# Patient Record
Sex: Female | Born: 1995 | Race: Asian | Hispanic: No | Marital: Single | Smoking: Never smoker
Health system: Southern US, Community
[De-identification: ages and names within clinical notes are randomized; demographics above are authoritative.]

## PROBLEM LIST (undated history)

## (undated) HISTORY — PX: WISDOM TOOTH EXTRACTION: SHX21

---
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
## 2017-01-03 ENCOUNTER — Encounter: Payer: Self-pay | Admitting: Family Medicine

## 2017-01-03 ENCOUNTER — Ambulatory Visit: Payer: BC Managed Care – PPO | Admitting: Family Medicine

## 2017-01-03 ENCOUNTER — Other Ambulatory Visit (HOSPITAL_COMMUNITY)
Admission: RE | Admit: 2017-01-03 | Discharge: 2017-01-03 | Disposition: A | Discharge status: Home / Self Care | Payer: BC Managed Care – PPO | Source: Ambulatory Visit | Attending: Family Medicine | Admitting: Family Medicine

## 2017-01-03 VITALS — BP 118/70 | HR 72 | Temp 98.2°F | Wt 133.0 lb

## 2017-01-03 DIAGNOSIS — N898 Other specified noninflammatory disorders of vagina: Secondary | ICD-10-CM | POA: Insufficient documentation

## 2017-01-03 DIAGNOSIS — Z23 Encounter for immunization: Secondary | ICD-10-CM | POA: Diagnosis not present

## 2017-01-03 DIAGNOSIS — E538 Deficiency of other specified B group vitamins: Secondary | ICD-10-CM

## 2017-01-03 MED ORDER — FLUCONAZOLE 150 MG PO TABS: ORAL_TABLET | ORAL | 0 refills | Status: DC

## 2017-01-03 MED ORDER — NYSTATIN-TRIAMCINOLONE 100000-0.1 UNIT/GM-% EX OINT: 1.0000 "application " | TOPICAL_OINTMENT | Freq: Two times a day (BID) | CUTANEOUS | 0 refills | Status: DC

## 2017-01-03 NOTE — Progress Notes (Signed)
Harlon FlorShalini Packard is a 21 y.o. female here for an acute visit.  History of Present Illness:   HPI:   Vaginitis Patient presents for evaluation of a vaginal discharge and itching at perineum. Symptoms have been present for a few weeks. Vaginal symptoms: discharge described as white and curd-like and local irritation. Contraception: OCP (estrogen/progesterone). She denies abnormal bleeding, blisters, bumps, dyspareunia, lesions, tears, urinary symptoms of dysuria, hematuria and lower abdominal pain and warts. Sexually transmitted infection risk: very low risk of STD exposure. Menstrual flow: regular every 28-30 days.  PMHx, SurgHx, SocialHx, Medications, and Allergies were reviewed in the Visit Navigator and updated as appropriate.  Current Medications:   .  cetirizine (ZYRTEC) 10 MG tablet, Take 10 mg by mouth daily., Disp: , Rfl:  .  levonorgestrel-ethinyl estradiol (AVIANE,ALESSE,LESSINA) 0.1-20 MG-MCG tablet, Take 1 tablet by mouth daily. (Patient not taking: Reported on 08/30/2016), Disp: 1 Package, Rfl: 11 .  Multiple Vitamin (MULTIVITAMIN) tablet, Take 1 tablet by mouth daily., Disp: , Rfl:  .  Omega-3 Fatty Acids (OMEGA-3 FISH OIL PO), Take 1 capsule by mouth daily., Disp: , Rfl:  .  valACYclovir (VALTREX) 1000 MG tablet, Take two tablets ( total 2000 mg) by mouth q12h x 1 day; Start: ASAP after symptom onset (Patient not taking: Reported on 08/30/2016), Disp: 6 tablet, Rfl: 2   No Known Allergies   Review of Systems:   Pertinent items are noted in the HPI. Otherwise, ROS is negative.  Vitals:   Vitals:   01/03/17 1323  BP: 118/70  Pulse: 72  Temp: 98.2 F (36.8 C)  TempSrc: Oral  SpO2: 100%  Weight: 133 lb (60.3 kg)     Body mass index is 22.13 kg/m.   Physical Exam:   Physical Exam  Constitutional: She appears well-nourished.  HENT:  Head: Normocephalic and atraumatic.  Eyes: EOM are normal. Pupils are equal, round, and reactive to light.  Neck: Normal range of  motion. Neck supple.  Cardiovascular: Normal rate, regular rhythm, normal heart sounds and intact distal pulses.  Pulmonary/Chest: Effort normal.  Abdominal: Soft.  Genitourinary: There is no rash or lesion on the right labia. There is no rash or lesion on the left labia. Cervix exhibits no motion tenderness, no discharge and no friability. Vaginal discharge found.  Skin: Skin is warm.  Psychiatric: She has a normal mood and affect. Her behavior is normal.  Nursing note and vitals reviewed.   Results for orders placed or performed in visit on 01/03/17  Cervicovaginal ancillary only  Result Value Ref Range   Bacterial vaginitis Negative for Bacterial Vaginitis Microorganisms    Candida vaginitis **POSITIVE for Candida species** (A)    Trichomonas Negative    Assessment and Plan:   Quinne was seen today for vaginal itching.  Diagnoses and all orders for this visit:  Vaginal itching -     fluconazole (DIFLUCAN) 150 MG tablet; One tablet then repeat in three days. -     nystatin-triamcinolone ointment (MYCOLOG); Apply 1 application topically 2 (two) times daily. PRN Itching. -     Cervicovaginal ancillary only -     CBC with Differential/Platelet; Future  B12 deficiency Comments: Hx of. Needs recheck.  Orders: -     CBC with Differential/Platelet; Future -     Vitamin B12; Future  Need for immunization against influenza -     Flu Vaccine QUAD 36+ mos IM   . Reviewed expectations re: course of current medical issues. . Discussed self-management of symptoms. .Marland Kitchen  Outlined signs and symptoms indicating need for more acute intervention. . Patient verbalized understanding and all questions were answered. Marland Kitchen. Health Maintenance issues including appropriate healthy diet, exercise, and smoking avoidance were discussed with patient. . See orders for this visit as documented in the electronic medical record. . Patient received an After Visit Summary.  Helane RimaErica Thayne Cindric, DO [place] 01/06/2017

## 2017-01-04 ENCOUNTER — Other Ambulatory Visit (INDEPENDENT_AMBULATORY_CARE_PROVIDER_SITE_OTHER): Payer: BC Managed Care – PPO

## 2017-01-04 DIAGNOSIS — N898 Other specified noninflammatory disorders of vagina: Secondary | ICD-10-CM | POA: Diagnosis not present

## 2017-01-04 DIAGNOSIS — E538 Deficiency of other specified B group vitamins: Secondary | ICD-10-CM

## 2017-01-04 LAB — CBC WITH DIFFERENTIAL/PLATELET
Basophils Absolute: 0 10*3/uL (ref 0.0–0.1)
Basophils Relative: 0.4 % (ref 0.0–3.0)
Eosinophils Absolute: 0.1 10*3/uL (ref 0.0–0.7)
Eosinophils Relative: 1.3 % (ref 0.0–5.0)
HCT: 39.3 % (ref 36.0–49.0)
Hemoglobin: 12.8 g/dL (ref 12.0–16.0)
Lymphocytes Relative: 35.8 % (ref 24.0–48.0)
Lymphs Abs: 3 10*3/uL (ref 0.7–4.0)
MCHC: 32.4 g/dL (ref 31.0–37.0)
MCV: 81.1 fl (ref 78.0–98.0)
Monocytes Absolute: 0.8 10*3/uL (ref 0.1–1.0)
Monocytes Relative: 9.8 % (ref 3.0–12.0)
Neutro Abs: 4.4 10*3/uL (ref 1.4–7.7)
Neutrophils Relative %: 52.7 % (ref 43.0–71.0)
Platelets: 282 10*3/uL (ref 150.0–575.0)
RBC: 4.85 Mil/uL (ref 3.80–5.70)
RDW: 16.2 % — ABNORMAL HIGH (ref 11.4–15.5)
WBC: 8.3 10*3/uL (ref 4.5–13.5)

## 2017-01-04 LAB — VITAMIN B12: Vitamin B-12: 1246 pg/mL — ABNORMAL HIGH (ref 211–911)

## 2017-01-05 LAB — CERVICOVAGINAL ANCILLARY ONLY
Bacterial vaginitis: NEGATIVE
Candida vaginitis: POSITIVE — AB
Trichomonas: NEGATIVE

## 2017-03-26 ENCOUNTER — Telehealth: Payer: Self-pay | Admitting: Family Medicine

## 2017-03-26 DIAGNOSIS — N898 Other specified noninflammatory disorders of vagina: Secondary | ICD-10-CM

## 2017-03-26 NOTE — Telephone Encounter (Signed)
Copied from CRM [phone]. Topic: Referral - Request >> Mar 26, 2017  1:59 PM Louie BunPalacios Medina, Rosey Batheresa D wrote: Reason for CRM: Patient called and said that she would like a referral to see a gynecologist if Dr. Earlene PlaterWallace can put this in for her and would give her a call back, thanks.

## 2017-03-26 NOTE — Telephone Encounter (Signed)
Left message on voicemail to call office. Please tell pt referral to GYN was ordered and someone will be contacting her to schedule an appt.

## 2017-03-26 NOTE — Telephone Encounter (Signed)
See note. Jarold MottoSamantha Worley, PA is her PCP.

## 2017-03-30 ENCOUNTER — Ambulatory Visit: Payer: BC Managed Care – PPO | Admitting: Obstetrics and Gynecology

## 2017-03-30 ENCOUNTER — Encounter: Payer: Self-pay | Admitting: Obstetrics and Gynecology

## 2017-03-30 ENCOUNTER — Other Ambulatory Visit: Payer: Self-pay

## 2017-03-30 VITALS — BP 110/70 | HR 72 | Resp 16 | Ht 65.0 in | Wt 133.0 lb

## 2017-03-30 DIAGNOSIS — N761 Subacute and chronic vaginitis: Secondary | ICD-10-CM | POA: Insufficient documentation

## 2017-03-30 MED ORDER — NORETHINDRONE 0.35 MG PO TABS: 1.0000 | ORAL_TABLET | Freq: Every day | ORAL | 1 refills | Status: DC

## 2017-03-30 MED ORDER — NYSTATIN-TRIAMCINOLONE 100000-0.1 UNIT/GM-% EX CREA: 1.0000 "application " | TOPICAL_CREAM | Freq: Two times a day (BID) | CUTANEOUS | 0 refills | Status: DC

## 2017-03-30 NOTE — Progress Notes (Signed)
GYNECOLOGY  VISIT   HPI: 22 y.o.   Single  [place]  female   G0P0000 with Patient's last menstrual period was 03/25/2017.   here for vaginal/perianal itching off and on since October. Currently sexually active per patient  Seen by Helane Rima 12/2016 and treated with Diflucan and Mycolog for Candida vaginitis.  Diflucan helped.   Itching is so pronounced that the skin becomes inflamed an raw.  Hydrocortisone helps somewhat.   Panty liners increases her irritation.  Stopped shaving.  Uses Dove for washing.   Avoids underwear due to symptoms.  Wearing leggings helps.  Saw GYN at [facility] student [facility].  Did not get much help.  Normal glucose.   Prior STD testing in 2018 negative for HIV, HSV, GC/CT, trichomonas.  On combined OCPs since winter break.  First sexual activity was in May.  Dating a new partner since October and starting having intimacy in December.  (He was a virgin.) No issues on his part.   Using a nonlatex condom only once so far.   Taking probiotics.   [facility].  Just admitted to public health school and wants to go dental school.  GYNECOLOGIC HISTORY: Patient's last menstrual period was 03/25/2017. Contraception:  OCP/condoms Menopausal hormone therapy:  n/a Last mammogram:  n/a Last pap smear:   n/a        OB History    Gravida Para Term Preterm AB Living   0 0 0 0 0 0   SAB TAB Ectopic Multiple Live Births   0 0 0 0 0         There are no active problems to display for this patient.   History reviewed. No pertinent past medical history.  History reviewed. No pertinent surgical history.  Current Outpatient Medications  Medication Sig Dispense Refill  . cetirizine (ZYRTEC) 10 MG tablet Take 10 mg by mouth daily.    . fexofenadine (ALLEGRA) 180 MG tablet Take by mouth daily.    Marland Kitchen levonorgestrel-ethinyl estradiol (AVIANE,ALESSE,LESSINA) 0.1-20 MG-MCG tablet Take 1 tablet by mouth daily. 1 Package 11  . Multiple Vitamin  (MULTIVITAMIN) tablet Take 1 tablet by mouth daily.    . Omega-3 Fatty Acids (OMEGA-3 FISH OIL PO) Take 1 capsule by mouth daily.     No current facility-administered medications for this visit.      ALLERGIES: Patient has no known allergies.  Family History  Problem Relation Age of Onset  . Hyperlipidemia Mother   . Hypertension Mother   . Asthma Sister   . Hyperlipidemia Maternal Grandfather   . Hypertension Maternal Grandfather   . Heart attack Maternal Grandfather   . Stroke Paternal Grandfather     Social History   Socioeconomic History  . Marital status: Single    Spouse name: Not on file  . Number of children: Not on file  . Years of education: Not on file  . Highest education level: Not on file  Social Needs  . Financial resource strain: Not on file  . Food insecurity - worry: Not on file  . Food insecurity - inability: Not on file  . Transportation needs - medical: Not on file  . Transportation needs - non-medical: Not on file  Occupational History  . Not on file  Tobacco Use  . Smoking status: Never Smoker  . Smokeless tobacco: Never Used  Substance and Sexual Activity  . Alcohol use: No  . Drug use: No  . Sexual activity: Yes    Birth control/protection: Condom,  Pill  Other Topics Concern  . Not on file  Social History Narrative   Works at [employer]   [employer], just finished Freshman Year   Studying [employer]   Roommate at college   Currently single    ROS:  Pertinent items are noted in HPI.  PHYSICAL EXAMINATION:    BP 110/70 (BP Location: Right Arm, Patient Position: Sitting, Cuff Size: Normal)   Pulse 72   Resp 16   Ht 5\' 5"  (1.651 m)   Wt 133 lb (60.3 kg)   LMP 03/25/2017   BMI 22.13 kg/m     General appearance: alert, cooperative and appears stated age Head: Normocephalic, without obvious abnormality, atraumatic Neck: no adenopathy, supple, symmetrical, trachea midline and thyroid normal to inspection and  palpation Lungs: clear to auscultation bilaterally Heart: regular rate and rhythm Abdomen: soft, non-tender, no masses,  no organomegaly Extremities: extremities normal, atraumatic, no cyanosis or edema No abnormal inguinal nodes palpated Neurologic: Grossly normal  Pelvic: External genitalia:   Whitish skin change to the labia bilaterally.  No lesions.               Urethra:  normal appearing urethra with no masses, tenderness or lesions              Bartholins and Skenes: normal                 Vagina: normal appearing vagina with normal color and discharge, no lesions              Cervix: no lesions                Bimanual Exam:  Uterus:  normal size, contour, position, consistency, mobility, non-tender              Adnexa: no mass, fullness, tenderness              Chaperone was present for exam.  ASSESSMENT  Recurrent vulvovaginitis.   PLAN  Discussion of vaginitis and vulvitis and risk factors.  NuSwab.  Mycolog II.  Switch to spermicide free condoms and use a new detergent.  Switch to Micronor.  Instructed in use.  May need suppressive regimen.  Consider vulvar biopsy.  [facility] may [facility] as she is here only on spring break.   An After Visit Summary was printed and given to the patient.  __30____ minutes face to face time of which over 50% was spent in counseling.

## 2017-03-30 NOTE — Patient Instructions (Signed)

## 2017-03-31 LAB — NUSWAB VAGINITIS (VG)
CANDIDA GLABRATA, NAA: NEGATIVE
Candida albicans, NAA: NEGATIVE
TRICH VAG BY NAA: NEGATIVE

## 2017-06-01 ENCOUNTER — Encounter: Payer: BC Managed Care – PPO | Admitting: Physician Assistant

## 2017-06-08 ENCOUNTER — Telehealth: Payer: Self-pay

## 2017-06-08 NOTE — Telephone Encounter (Signed)
Copied from CRM [id number]. Topic: Referral - Request >> Jun 08, 2017  2:21 PM Debroah Loop wrote: Reason for CRM: Patient would like a referral to ob/gyn for vaginal concerns noted at office visit 01/03/2018 with Dr. Earlene Plater. Please call patient once referral is placed.

## 2017-06-12 NOTE — Telephone Encounter (Signed)
Okay referral.

## 2017-06-12 NOTE — Telephone Encounter (Signed)
Ok to place referral.

## 2017-06-13 ENCOUNTER — Other Ambulatory Visit: Payer: Self-pay

## 2017-06-13 DIAGNOSIS — N898 Other specified noninflammatory disorders of vagina: Secondary | ICD-10-CM

## 2017-06-13 NOTE — Telephone Encounter (Signed)
Referral put in.

## 2017-06-14 ENCOUNTER — Telehealth: Payer: Self-pay | Admitting: Obstetrics and Gynecology

## 2017-06-14 NOTE — Telephone Encounter (Signed)
Called and left a message for patient to call back to schedule a new patient doctor referral appointment with our office to see any provider for: vaginal discharge.

## 2017-06-14 NOTE — Telephone Encounter (Signed)
Returned call to this existing patient who has been referred to our office again for vaginal discharge, vaginal itching. Patient requested to see Dr. Oscar La specifically this time although she saw Dr. Edward Jolly at [facility] because she'd like a second opinion.  Routing to Dr. Edward Jolly and Dr. Oscar La for FYI only.

## 2017-06-14 NOTE — Telephone Encounter (Signed)
Patient returned call

## 2017-06-18 NOTE — Progress Notes (Deleted)
GYNECOLOGY  VISIT   HPI: 22 y.o.   Single  [place]  female   G0P0000 with No LMP recorded.   here for second opinion    GYNECOLOGIC HISTORY: No LMP recorded. Contraception:OCPs/condoms Menopausal hormone therapy: n/a        OB History    Gravida  0   Para  0   Term  0   Preterm  0   AB  0   Living  0     SAB  0   TAB  0   Ectopic  0   Multiple  0   Live Births  0              Patient Active Problem List   Diagnosis Date Noted  . Chronic vulvovaginitis 03/30/2017    No past medical history on file.  No past surgical history on file.  Current Outpatient Medications  Medication Sig Dispense Refill  . cetirizine (ZYRTEC) 10 MG tablet Take 10 mg by mouth daily.    . fexofenadine (ALLEGRA) 180 MG tablet Take by mouth daily.    . Multiple Vitamin (MULTIVITAMIN) tablet Take 1 tablet by mouth daily.    . norethindrone (MICRONOR,CAMILA,ERRIN) 0.35 MG tablet Take 1 tablet (0.35 mg total) by mouth daily. 3 Package 1  . nystatin-triamcinolone (MYCOLOG II) cream Apply 1 application topically 2 (two) times daily. Apply to affected area BID for up to 7 days. 60 g 0  . Omega-3 Fatty Acids (OMEGA-3 FISH OIL PO) Take 1 capsule by mouth daily.     No current facility-administered medications for this visit.      ALLERGIES: Patient has no known allergies.  Family History  Problem Relation Age of Onset  . Hyperlipidemia Mother   . Hypertension Mother   . Asthma Sister   . Hyperlipidemia Maternal Grandfather   . Hypertension Maternal Grandfather   . Heart attack Maternal Grandfather   . Stroke Paternal Grandfather     Social History   Socioeconomic History  . Marital status: Single    Spouse name: Not on file  . Number of children: Not on file  . Years of education: Not on file  . Highest education level: Not on file  Occupational History  . Not on file  Social Needs  . Financial resource strain: Not on file  . Food insecurity:    Worry: Not on file   Inability: Not on file  . Transportation needs:    Medical: Not on file    Non-medical: Not on file  Tobacco Use  . Smoking status: Never Smoker  . Smokeless tobacco: Never Used  Substance and Sexual Activity  . Alcohol use: No  . Drug use: No  . Sexual activity: Yes    Birth control/protection: Condom, Pill  Lifestyle  . Physical activity:    Days per week: Not on file    Minutes per session: Not on file  . Stress: Not on file  Relationships  . Social connections:    Talks on phone: Not on file    Gets together: Not on file    Attends religious service: Not on file    Active member of club or organization: Not on file    Attends meetings of clubs or organizations: Not on file    Relationship status: Not on file  . Intimate partner violence:    Fear of current or ex partner: Not on file    Emotionally abused: Not on file    Physically  abused: Not on file    Forced sexual activity: Not on file  Other Topics Concern  . Not on file  Social History Narrative   Works at [employer]   [employer], just finished Freshman Year   Studying [employer]   Roommate at college   Currently single    ROS  PHYSICAL EXAMINATION:    There were no vitals taken for this visit.    General appearance: alert, cooperative and appears stated age Neck: no adenopathy, supple, symmetrical, trachea midline and thyroid {CHL AMB PHY EX THYROID NORM [phone]::"normal to inspection and palpation"} Breasts: {Exam; breast:13139::"normal appearance, no masses or tenderness"} Abdomen: soft, non-tender; non distended, no masses,  no organomegaly  Pelvic: External genitalia:  no lesions              Urethra:  normal appearing urethra with no masses, tenderness or lesions              Bartholins and Skenes: normal                 Vagina: normal appearing vagina with normal color and discharge, no lesions              Cervix: {CHL AMB PHY EX CERVIX NORM  DEFAULT:[phone]::"no lesions"}              Bimanual Exam:  Uterus:  {CHL AMB PHY EX UTERUS NORM [phone]::"normal size, contour, position, consistency, mobility, non-tender"}              Adnexa: {CHL AMB PHY EX ADNEXA NO MASS [phone]::"no mass, fullness, tenderness"}              Rectovaginal: {yes no:[id number]}.  Confirms.              Anus:  normal sphincter tone, no lesions  Chaperone was present for exam.  ASSESSMENT     PLAN    An After Visit Summary was printed and given to the patient.  *** minutes face to face time of which over 50% was spent in counseling.

## 2017-06-20 ENCOUNTER — Telehealth: Payer: Self-pay | Admitting: Obstetrics and Gynecology

## 2017-06-20 NOTE — Telephone Encounter (Signed)
Patient called and left a message after hours cancelling her existing patient doctor referral appointment with Dr. Oscar LaJertson for a second opinion re: discharge. The message the patient left said she is feeling better and the problem has gone away. I returned the patient's call and left a message confirming the appointment had been cancelled as requested.  Will send referral message to referring provider and cancel referral.   Cc: Dr. Edward JollySilva

## 2017-06-21 ENCOUNTER — Ambulatory Visit: Payer: BC Managed Care – PPO | Admitting: Obstetrics and Gynecology

## 2017-07-18 ENCOUNTER — Other Ambulatory Visit (HOSPITAL_COMMUNITY)
Admission: RE | Admit: 2017-07-18 | Discharge: 2017-07-18 | Disposition: A | Discharge status: Home / Self Care | Payer: BC Managed Care – PPO | Source: Ambulatory Visit | Attending: Physician Assistant | Admitting: Physician Assistant

## 2017-07-18 ENCOUNTER — Ambulatory Visit (INDEPENDENT_AMBULATORY_CARE_PROVIDER_SITE_OTHER): Payer: BC Managed Care – PPO | Admitting: Physician Assistant

## 2017-07-18 ENCOUNTER — Encounter: Payer: Self-pay | Admitting: Physician Assistant

## 2017-07-18 VITALS — BP 110/66 | HR 81 | Temp 99.1°F | Ht 65.0 in | Wt 133.2 lb

## 2017-07-18 DIAGNOSIS — N761 Subacute and chronic vaginitis: Secondary | ICD-10-CM | POA: Insufficient documentation

## 2017-07-18 DIAGNOSIS — Z113 Encounter for screening for infections with a predominantly sexual mode of transmission: Secondary | ICD-10-CM | POA: Insufficient documentation

## 2017-07-18 DIAGNOSIS — F419 Anxiety disorder, unspecified: Secondary | ICD-10-CM

## 2017-07-18 DIAGNOSIS — E559 Vitamin D deficiency, unspecified: Secondary | ICD-10-CM

## 2017-07-18 DIAGNOSIS — Z0000 Encounter for general adult medical examination without abnormal findings: Secondary | ICD-10-CM

## 2017-07-18 LAB — COMPREHENSIVE METABOLIC PANEL
ALT: 13 U/L (ref 0–35)
AST: 17 U/L (ref 0–37)
Albumin: 4.1 g/dL (ref 3.5–5.2)
Alkaline Phosphatase: 52 U/L (ref 47–119)
BILIRUBIN TOTAL: 0.4 mg/dL (ref 0.2–1.2)
BUN: 9 mg/dL (ref 6–23)
CALCIUM: 9.4 mg/dL (ref 8.4–10.5)
CHLORIDE: 103 meq/L (ref 96–112)
CO2: 28 meq/L (ref 19–32)
Creatinine, Ser: 0.66 mg/dL (ref 0.40–1.20)
GFR: 121.86 mL/min (ref >60.00)
GLUCOSE: 92 mg/dL (ref 70–99)
Potassium: 4 mEq/L (ref 3.5–5.1)
Sodium: 139 mEq/L (ref 135–145)
Total Protein: 6.9 g/dL (ref 6.0–8.3)

## 2017-07-18 LAB — CBC
HCT: 37.8 % (ref 36.0–49.0)
HEMOGLOBIN: 12.5 g/dL (ref 12.0–16.0)
MCHC: 33 g/dL (ref 31.0–37.0)
MCV: 80.9 fl (ref 78.0–98.0)
PLATELETS: 274 10*3/uL (ref 150.0–575.0)
RBC: 4.67 Mil/uL (ref 3.80–5.70)
RDW: 13.6 % (ref 11.4–15.5)
WBC: 7.3 10*3/uL (ref 4.5–13.5)

## 2017-07-18 LAB — VITAMIN D 25 HYDROXY (VIT D DEFICIENCY, FRACTURES): VITD: 36.08 ng/mL (ref 30.00–100.00)

## 2017-07-18 NOTE — Progress Notes (Signed)
I acted as a Neurosurgeon for [employer], PA-C Corky Mull, LPN  Subjective:    Katrina Wood is a 22 y.o. female and is here for a comprehensive physical exam.  HPI  There are no preventive care reminders to display for this patient.  Acute Concerns: None  Chronic Issues: STD screening -- would like testing today. Had two partners since she last saw me. Currently asymptomatic and not sexually active. Anxiety -- seeing a therapist near college campus, not on medication. Doing well. Vit D deficiency -- hx of this. Does not take supplementation. Chronic vulvovaginitis -- has seen Dr Edward Jolly (ob-gyn) for this. Has tried diflucan and mycolog without much relief. Most recent note from Dr. Edward Jolly with plan to consider bx and/or suppressive therapy.  Health Maintenance: Immunizations -- UTD Colonoscopy -- N/A Mammogram -- N/A PAP -- Never Bone Density -- N/A Diet -- healthy Caffeine intake -- minimal Sleep habits -- sleeps well Exercise -- as able Current Weight -- Weight: 133 lb 4 oz (60.4 kg)  Weight History: Wt Readings from Last 10 Encounters:  07/18/17 133 lb 4 oz (60.4 kg) (60 %, Z= 0.25)*  03/30/17 133 lb (60.3 kg) (61 %, Z= 0.27)*  01/03/17 133 lb (60.3 kg) (62 %, Z= 0.30)*  08/30/16 136 lb 4 oz (61.8 kg) (68 %, Z= 0.47)*  08/10/16 138 lb (62.6 kg) (71 %, Z= 0.54)*  07/14/16 141 lb 6.1 oz (64.1 kg) (75 %, Z= 0.67)*   * Growth percentiles are based on CDC (Girls, 2-20 Years) data.   Mood -- anxiety at times Patient's last menstrual period was 06/17/2017. Birth control -- condoms, off of her ocp's  Depression screen [facility] 2/9 07/18/2017  Decreased Interest 0  Down, Depressed, Hopeless 0  PHQ - 2 Score 0   Other providers/specialists: Edward Jolly - ob-gyn   PMHx, SurgHx, SocialHx, Medications, and Allergies were reviewed in the Visit Navigator and updated as appropriate.   History reviewed. No pertinent past medical history.  History reviewed. No pertinent surgical  history.   Family History  Problem Relation Age of Onset  . Hyperlipidemia Mother   . Hypertension Mother   . Asthma Sister   . Hyperlipidemia Maternal Grandfather   . Hypertension Maternal Grandfather   . Heart attack Maternal Grandfather   . Stroke Paternal Grandfather     Social History   Tobacco Use  . Smoking status: Never Smoker  . Smokeless tobacco: Never Used  Substance Use Topics  . Alcohol use: No  . Drug use: No    Review of Systems:   Review of Systems  Constitutional: Negative for chills, fever, malaise/fatigue and weight loss.  HENT: Negative for hearing loss, sinus pain and sore throat.   Respiratory: Negative for cough and hemoptysis.   Cardiovascular: Negative for chest pain, palpitations, leg swelling and PND.  Gastrointestinal: Negative for abdominal pain, constipation, diarrhea, heartburn, nausea and vomiting.  Genitourinary: Negative for dysuria, frequency and urgency.  Musculoskeletal: Negative for back pain, myalgias and neck pain.  Skin: Positive for itching. Negative for rash.  Neurological: Negative for dizziness, tingling, seizures and headaches.  Endo/Heme/Allergies: Negative for polydipsia.  Psychiatric/Behavioral: Negative for depression. The patient is nervous/anxious.     Objective:   BP 110/66 (BP Location: Left Arm, Patient Position: Sitting, Cuff Size: Normal)   Pulse 81   Temp 99.1 F (37.3 C) (Oral)   Ht 5\' 5"  (1.651 m)   Wt 133 lb 4 oz (60.4 kg)   LMP 06/17/2017   SpO2  99%   BMI 22.17 kg/m   General Appearance:    Alert, cooperative, no distress, appears stated age  Head:    Normocephalic, without obvious abnormality, atraumatic  Eyes:    PERRL, conjunctiva/corneas clear, EOM's intact, fundi    benign, both eyes  Ears:    Normal TM's and external ear canals, both ears  Nose:   Nares normal, septum midline, mucosa normal, no drainage    or sinus tenderness  Throat:   Lips, mucosa, and tongue normal; teeth and gums normal    Neck:   Supple, symmetrical, trachea midline, no adenopathy;    thyroid:  no enlargement/tenderness/nodules; no carotid   bruit or JVD  Back:     Symmetric, no curvature, ROM normal, no CVA tenderness  Lungs:     Clear to auscultation bilaterally, respirations unlabored  Chest Wall:    No tenderness or deformity   Heart:    Regular rate and rhythm, S1 and S2 normal, no murmur, rub   or gallop  Breast Exam:    Deferred  Abdomen:     Soft, non-tender, bowel sounds active all four quadrants,    no masses, no organomegaly  Genitalia:    Deferred  Rectal:    Deferred  Extremities:   Extremities normal, atraumatic, no cyanosis or edema  Pulses:   2+ and symmetric all extremities  Skin:   Skin color, texture, turgor normal, no rashes or lesions  Lymph nodes:   Cervical, supraclavicular, and axillary nodes normal  Neurologic:   CNII-XII intact, normal strength, sensation and reflexes    throughout    Assessment/Plan:   Katrina Wood was seen today for annual exam.  Diagnoses and all orders for this visit:  Routine physical examination Today patient counseled on age appropriate routine health concerns for screening and prevention, each reviewed and up to date or declined. Immunizations reviewed and up to date or declined. Labs ordered and reviewed. Risk factors for depression reviewed and negative. Hearing function and visual acuity are intact. ADLs screened and addressed as needed. Functional ability and level of safety reviewed and appropriate. Education, counseling and referrals performed based on assessed risks today. Patient provided with a copy of personalized plan for preventive services.  Screening examination for STD (sexually transmitted disease) -     Urine cytology ancillary only  Anxiety Seeing a therapist. Declines medication at this time. -     Comprehensive metabolic panel -     CBC  Chronic vulvovaginitis Recommended that she consider trying RepHresh gel. Stressed the  importance of getting back into ob-gyn for possible biopsy and/or suppressive therapy if needed. Patient verbalized understanding. -     Urine cytology ancillary only  Vitamin D deficiency -     VITAMIN D 25 Hydroxy (Vit-D Deficiency, Fractures)    Well Adult Exam: Labs ordered: Yes. Patient counseling was done. See below for items discussed. Discussed the patient's BMI. The BMI BMI is in the acceptable range Follow up as needed for acute illness.  Patient Counseling:   [x]     Nutrition: Stressed importance of moderation in sodium/caffeine intake, saturated fat and cholesterol, caloric balance, sufficient intake of fresh fruits, vegetables, fiber, calcium, iron, and 1 mg of folate supplement per day (for females capable of pregnancy).   [x]      Stressed the importance of regular exercise.    [x]     Substance Abuse: Discussed cessation/primary prevention of tobacco, alcohol, or other drug use; driving or other dangerous activities under the influence; availability  of treatment for abuse.    [x]      Injury prevention: Discussed safety belts, safety helmets, smoke detector, smoking near bedding or upholstery.    [x]      Sexuality: Discussed sexually transmitted diseases, partner selection, use of condoms, avoidance of unintended pregnancy  and contraceptive alternatives.    [x]     Dental health: Discussed importance of regular tooth brushing, flossing, and dental visits.   [x]      Health maintenance and immunizations reviewed. Please refer to Health maintenance section.   CMA or LPN served as scribe during this visit. History, Physical, and Plan performed by medical provider. Documentation and orders reviewed and attested to.   Jarold MottoSamantha Marchel Foote, PA-C [facility]

## 2017-07-18 NOTE — Patient Instructions (Addendum)
It was great to see you!  We will contact you about your lab and urine results.   Consider using RepHresh gel. Follow-up with ob-gyn during your summer break so they can further work-up the itching.      Health Maintenance, Female Adopting a healthy lifestyle and getting preventive care can go a long way to promote health and wellness. Talk with your health care provider about what schedule of regular examinations is right for you. This is a good chance for you to check in with your provider about disease prevention and staying healthy. In between checkups, there are plenty of things you can do on your own. Experts have done a lot of research about which lifestyle changes and preventive measures are most likely to keep you healthy. Ask your health care provider for more information. Weight and diet Eat a healthy diet  Be sure to include plenty of vegetables, fruits, low-fat dairy products, and lean protein.  Do not eat a lot of foods high in solid fats, added sugars, or salt.  Get regular exercise. This is one of the most important things you can do for your health. ? Most adults should exercise for at least 150 minutes each week. The exercise should increase your heart rate and make you sweat (moderate-intensity exercise). ? Most adults should also do strengthening exercises at least twice a week. This is in addition to the moderate-intensity exercise.  Maintain a healthy weight  Body mass index (BMI) is a measurement that can be used to identify possible weight problems. It estimates body fat based on height and weight. Your health care provider can help determine your BMI and help you achieve or maintain a healthy weight.  For females 76 years of age and older: ? A BMI below 18.5 is considered underweight. ? A BMI of 18.5 to 24.9 is normal. ? A BMI of 25 to 29.9 is considered overweight. ? A BMI of 30 and above is considered obese.  Watch levels of cholesterol and blood  lipids  You should start having your blood tested for lipids and cholesterol at 20 years of age, then have this test every 5 years.  You may need to have your cholesterol levels checked more often if: ? Your lipid or cholesterol levels are high. ? You are older than 20 years of age. ? You are at high risk for heart disease.  Cancer screening Lung Cancer  Lung cancer screening is recommended for adults 18-58 years old who are at high risk for lung cancer because of a history of smoking.  A yearly low-dose CT scan of the lungs is recommended for people who: ? Currently smoke. ? Have quit within the past 15 years. ? Have at least a 30-pack-year history of smoking. A pack year is smoking an average of one pack of cigarettes a day for 1 year.  Yearly screening should continue until it has been 15 years since you quit.  Yearly screening should stop if you develop a health problem that would prevent you from having lung cancer treatment.  Breast Cancer  Practice breast self-awareness. This means understanding how your breasts normally appear and feel.  It also means doing regular breast self-exams. Let your health care provider know about any changes, no matter how small.  If you are in your 20s or 30s, you should have a clinical breast exam (CBE) by a health care provider every 1-3 years as part of a regular health exam.  If you are 40  or older, have a CBE every year. Also consider having a breast X-ray (mammogram) every year.  If you have a family history of breast cancer, talk to your health care provider about genetic screening.  If you are at high risk for breast cancer, talk to your health care provider about having an MRI and a mammogram every year.  Breast cancer gene (BRCA) assessment is recommended for women who have family members with BRCA-related cancers. BRCA-related cancers include: ? Breast. ? Ovarian. ? Tubal. ? Peritoneal cancers.  Results of the assessment will  determine the need for genetic counseling and BRCA1 and BRCA2 testing.  Cervical Cancer Your health care provider may recommend that you be screened regularly for cancer of the pelvic organs (ovaries, uterus, and vagina). This screening involves a pelvic examination, including checking for microscopic changes to the surface of your cervix (Pap test). You may be encouraged to have this screening done every 3 years, beginning at age 25.  For women ages 79-65, health care providers may recommend pelvic exams and Pap testing every 3 years, or they may recommend the Pap and pelvic exam, combined with testing for human papilloma virus (HPV), every 5 years. Some types of HPV increase your risk of cervical cancer. Testing for HPV may also be done on women of any age with unclear Pap test results.  Other health care providers may not recommend any screening for nonpregnant women who are considered low risk for pelvic cancer and who do not have symptoms. Ask your health care provider if a screening pelvic exam is right for you.  If you have had past treatment for cervical cancer or a condition that could lead to cancer, you need Pap tests and screening for cancer for at least 20 years after your treatment. If Pap tests have been discontinued, your risk factors (such as having a new sexual partner) need to be reassessed to determine if screening should resume. Some women have medical problems that increase the chance of getting cervical cancer. In these cases, your health care provider may recommend more frequent screening and Pap tests.  Colorectal Cancer  This type of cancer can be detected and often prevented.  Routine colorectal cancer screening usually begins at 20 years of age and continues through 20 years of age.  Your health care provider may recommend screening at an earlier age if you have risk factors for colon cancer.  Your health care provider may also recommend using home test kits to check  for hidden blood in the stool.  A small camera at the end of a tube can be used to examine your colon directly (sigmoidoscopy or colonoscopy). This is done to check for the earliest forms of colorectal cancer.  Routine screening usually begins at age 71.  Direct examination of the colon should be repeated every 5-10 years through 20 years of age. However, you may need to be screened more often if early forms of precancerous polyps or small growths are found.  Skin Cancer  Check your skin from head to toe regularly.  Tell your health care provider about any new moles or changes in moles, especially if there is a change in a mole's shape or color.  Also tell your health care provider if you have a mole that is larger than the size of a pencil eraser.  Always use sunscreen. Apply sunscreen liberally and repeatedly throughout the day.  Protect yourself by wearing long sleeves, pants, a wide-brimmed hat, and sunglasses whenever you are  outside.  Heart disease, diabetes, and high blood pressure  High blood pressure causes heart disease and increases the risk of stroke. High blood pressure is more likely to develop in: ? People who have blood pressure in the high end of the normal range (130-139/85-89 mm Hg). ? People who are overweight or obese. ? People who are African American.  If you are 48-51 years of age, have your blood pressure checked every 3-5 years. If you are 6 years of age or older, have your blood pressure checked every year. You should have your blood pressure measured twice-once when you are at a hospital or clinic, and once when you are not at a hospital or clinic. Record the average of the two measurements. To check your blood pressure when you are not at a hospital or clinic, you can use: ? An automated blood pressure machine at a pharmacy. ? A home blood pressure monitor.  If you are between 66 years and 88 years old, ask your health care provider if you should take  aspirin to prevent strokes.  Have regular diabetes screenings. This involves taking a blood sample to check your fasting blood sugar level. ? If you are at a normal weight and have a low risk for diabetes, have this test once every three years after 20 years of age. ? If you are overweight and have a high risk for diabetes, consider being tested at a younger age or more often. Preventing infection Hepatitis B  If you have a higher risk for hepatitis B, you should be screened for this virus. You are considered at high risk for hepatitis B if: ? You were born in a country where hepatitis B is common. Ask your health care provider which countries are considered high risk. ? Your parents were born in a high-risk country, and you have not been immunized against hepatitis B (hepatitis B vaccine). ? You have HIV or AIDS. ? You use needles to inject street drugs. ? You live with someone who has hepatitis B. ? You have had sex with someone who has hepatitis B. ? You get hemodialysis treatment. ? You take certain medicines for conditions, including cancer, organ transplantation, and autoimmune conditions.  Hepatitis C  Blood testing is recommended for: ? Everyone born from [birth year] through 1963. ? Anyone with known risk factors for hepatitis C.  Sexually transmitted infections (STIs)  You should be screened for sexually transmitted infections (STIs) including gonorrhea and chlamydia if: ? You are sexually active and are younger than 20 years of age. ? You are older than 20 years of age and your health care provider tells you that you are at risk for this type of infection. ? Your sexual activity has changed since you were last screened and you are at an increased risk for chlamydia or gonorrhea. Ask your health care provider if you are at risk.  If you do not have HIV, but are at risk, it may be recommended that you take a prescription medicine daily to prevent HIV infection. This is called  pre-exposure prophylaxis (PrEP). You are considered at risk if: ? You are sexually active and do not regularly use condoms or know the HIV status of your partner(s). ? You take drugs by injection. ? You are sexually active with a partner who has HIV.  Talk with your health care provider about whether you are at high risk of being infected with HIV. If you choose to begin PrEP, you should first be tested  for HIV. You should then be tested every 3 months for as long as you are taking PrEP. Pregnancy  If you are premenopausal and you may become pregnant, ask your health care provider about preconception counseling.  If you may become pregnant, take 400 to 800 micrograms (mcg) of folic acid every day.  If you want to prevent pregnancy, talk to your health care provider about birth control (contraception). Osteoporosis and menopause  Osteoporosis is a disease in which the bones lose minerals and strength with aging. This can result in serious bone fractures. Your risk for osteoporosis can be identified using a bone density scan.  If you are 60 years of age or older, or if you are at risk for osteoporosis and fractures, ask your health care provider if you should be screened.  Ask your health care provider whether you should take a calcium or vitamin D supplement to lower your risk for osteoporosis.  Menopause may have certain physical symptoms and risks.  Hormone replacement therapy may reduce some of these symptoms and risks. Talk to your health care provider about whether hormone replacement therapy is right for you. Follow these instructions at home:  Schedule regular health, dental, and eye exams.  Stay current with your immunizations.  Do not use any tobacco products including cigarettes, chewing tobacco, or electronic cigarettes.  If you are pregnant, do not drink alcohol.  If you are breastfeeding, limit how much and how often you drink alcohol.  Limit alcohol intake to no more  than 1 drink per day for nonpregnant women. One drink equals 12 ounces of beer, 5 ounces of wine, or 1 ounces of hard liquor.  Do not use street drugs.  Do not share needles.  Ask your health care provider for help if you need support or information about quitting drugs.  Tell your health care provider if you often feel depressed.  Tell your health care provider if you have ever been abused or do not feel safe at home. This information is not intended to replace advice given to you by your health care provider. Make sure you discuss any questions you have with your health care provider. Document Released: 07/18/2010 Document Revised: 06/10/2015 Document Reviewed: 10/06/2014 Elsevier Interactive Patient Education  [employer].

## 2017-07-19 LAB — URINE CYTOLOGY ANCILLARY ONLY
CHLAMYDIA, DNA PROBE: NEGATIVE
Neisseria Gonorrhea: NEGATIVE
TRICH (WINDOWPATH): NEGATIVE

## 2017-07-23 LAB — URINE CYTOLOGY ANCILLARY ONLY
BACTERIAL VAGINITIS: NEGATIVE
CANDIDA VAGINITIS: NEGATIVE

## 2017-07-27 ENCOUNTER — Other Ambulatory Visit (HOSPITAL_COMMUNITY)
Admission: RE | Admit: 2017-07-27 | Discharge: 2017-07-27 | Disposition: A | Discharge status: Home / Self Care | Payer: BC Managed Care – PPO | Source: Ambulatory Visit | Attending: Obstetrics and Gynecology | Admitting: Obstetrics and Gynecology

## 2017-07-27 ENCOUNTER — Encounter: Payer: Self-pay | Admitting: Obstetrics and Gynecology

## 2017-07-27 ENCOUNTER — Ambulatory Visit: Payer: BC Managed Care – PPO | Admitting: Obstetrics and Gynecology

## 2017-07-27 VITALS — BP 108/76 | HR 64 | Temp 98.3°F | Ht 65.0 in | Wt 136.0 lb

## 2017-07-27 DIAGNOSIS — N760 Acute vaginitis: Secondary | ICD-10-CM | POA: Diagnosis not present

## 2017-07-27 MED ORDER — FLUCONAZOLE 150 MG PO TABS: 150.0000 mg | ORAL_TABLET | Freq: Once | ORAL | 0 refills | Status: AC

## 2017-07-27 NOTE — Progress Notes (Signed)
GYNECOLOGY  VISIT   HPI: 22 y.o.   Single [place] American  female   [id number] with Patient's last menstrual period was 07/27/2017 (exact date).   here for itching for one year.  Has irritation with redness and swelling. Having symptoms that come and go.  Not sure what it is.  Stopped shaving.   The shower seems to irritate more.  Stopped her birth control and symptoms persist.  Creams make it burn.  Hydrocortisone helps somewhat.   Not sexually active in 2 months.  GYNECOLOGIC HISTORY: Patient's last menstrual period was 07/27/2017 (exact date). Contraception: ----  Stopped Micronor.  Menopausal hormone therapy: NA Last mammogram:  NA Last pap smear:   NA        OB History    Gravida  0   Para  0   Term  0   Preterm  0   AB  0   Living  0     SAB  0   TAB  0   Ectopic  0   Multiple  0   Live Births  0              Patient Active Problem List   Diagnosis Date Noted  . Chronic vulvovaginitis 03/30/2017    History reviewed. No pertinent past medical history.  History reviewed. No pertinent surgical history.  Current Outpatient Medications  Medication Sig Dispense Refill  . BIOTIN PO Take 1 tablet by mouth daily.    . cetirizine (ZYRTEC) 10 MG tablet Take 10 mg by mouth daily.    . fexofenadine (ALLEGRA) 180 MG tablet Take by mouth daily.    . Multiple Vitamin (MULTIVITAMIN) tablet Take 1 tablet by mouth daily.    . norethindrone (MICRONOR,CAMILA,ERRIN) 0.35 MG tablet Take 1 tablet (0.35 mg total) by mouth daily. (Patient not taking: Reported on 07/27/2017) 3 Package 1   No current facility-administered medications for this visit.      ALLERGIES: Patient has no known allergies.  Family History  Problem Relation Age of Onset  . Hyperlipidemia Mother   . Hypertension Mother   . Asthma Sister   . Hyperlipidemia Maternal Grandfather   . Hypertension Maternal Grandfather   . Heart attack Maternal Grandfather   . Stroke Paternal Grandfather      Social History   Socioeconomic History  . Marital status: Single    Spouse name: Not on file  . Number of children: Not on file  . Years of education: Not on file  . Highest education level: Not on file  Occupational History  . Not on file  Social Needs  . Financial resource strain: Not on file  . Food insecurity:    Worry: Not on file    Inability: Not on file  . Transportation needs:    Medical: Not on file    Non-medical: Not on file  Tobacco Use  . Smoking status: Never Smoker  . Smokeless tobacco: Never Used  Substance and Sexual Activity  . Alcohol use: No  . Drug use: No  . Sexual activity: Yes    Birth control/protection: Condom, Pill  Lifestyle  . Physical activity:    Days per week: Not on file    Minutes per session: Not on file  . Stress: Not on file  Relationships  . Social connections:    Talks on phone: Not on file    Gets together: Not on file    Attends religious service: Not on file    Active  member of club or organization: Not on file    Attends meetings of clubs or organizations: Not on file    Relationship status: Not on file  . Intimate partner violence:    Fear of current or ex partner: Not on file    Emotionally abused: Not on file    Physically abused: Not on file    Forced sexual activity: Not on file  Other Topics Concern  . Not on file  Social History Narrative   Works at [employer]   [employer], just finished Freshman Year   Studying [employer]   Roommate at college   Currently single    Review of Systems - Negative other than above.  PHYSICAL EXAMINATION:    BP 108/76   Pulse 64   Temp 98.3 F (36.8 C)   Ht 5\' 5"  (1.651 m)   Wt 136 lb (61.7 kg)   LMP 07/27/2017 (Exact Date)   BMI 22.63 kg/m     General appearance: alert, cooperative and appears stated age   Pelvic: External genitalia:  no lesions              Urethra:  normal appearing urethra with no masses, tenderness or lesions               Bartholins and Skenes: normal                 Vagina: normal appearing vagina with normal color and discharge, no lesions              Cervix: no lesions                Bimanual Exam:  Uterus:  normal size, contour, position, consistency, mobility, non-tender              Adnexa: no mass, fullness, tenderness              Chaperone was present for exam.  ASSESSMENT  Chronic vulvovaginitis. Infection versus lichen sclerosus versus vulvodynia? Taking daily Allegra.   PLAN  Vaginitis testing from [facility].  Will empirically treat with Diflucan 150 mg po.  May repeat in 72 hours.  I am recommending a vulvar biopsy to determine to determine if the patient has lichen sclerosus.  We discussed lichen sclerosus.    An After Visit Summary was printed and given to the patient.  __15____ minutes face to face time of which over 50% was spent in counseling.

## 2017-07-27 NOTE — Patient Instructions (Signed)
Lichen Sclerosus  Lichen sclerosus is a skin problem. It can happen on any part of the body, but it commonly involves the anal or genital areas. It can cause itching and discomfort in these areas. Treatment can help to control symptoms. When the genital area is affected, getting treatment is important because the condition can cause scarring that may lead to other problems.  What are the causes?  The cause of this condition is not known. It could be the result of an overactive immune system or a lack of certain hormones. Lichen sclerosus is not an infection or a fungus. It is not passed from one person to another (not contagious).  What increases the risk?  This condition is more likely to develop in women, usually after menopause.  What are the signs or symptoms?  Symptoms of this condition include:  · Thin, wrinkled, white areas on the skin.  · Thickened white areas on the skin.  · Red and swollen patches (lesions) on the skin.  · Tears or cracks in the skin.  · Bruising.  · Blood blisters.  · Severe itching.    You may also have pain, itching, or burning with urination. Constipation is also common in people with lichen sclerosus.  How is this diagnosed?  This condition may be diagnosed with a physical exam. In some cases, a tissue sample (biopsy sample) may be removed to be looked at under a microscope.  How is this treated?  This condition is usually treated with medicated creams or ointments (topical steroids) that are applied over the affected areas.  Follow these instructions at home:  · Take over-the-counter and prescription medicines only as told by your health care provider.  · Use creams or ointments as told by your health care provider.  · Do not scratch the affected areas of skin.  · Women should keep the vaginal area as clean and dry as possible.  · Keep all follow-up visits as told by your health care provider. This is important.  Contact a health care provider if:  · You have increasing redness,  swelling, or pain in the affected area.  · You have fluid, blood, or pus coming from the affected area.  · You have new lesions on your skin.  · You have a fever.  · You have pain during sex.  This information is not intended to replace advice given to you by your health care provider. Make sure you discuss any questions you have with your health care provider.  Document Released: 05/25/2010 Document Revised: 06/10/2015 Document Reviewed: 03/30/2014  Elsevier Interactive Patient Education © 2018 Elsevier Inc.

## 2017-07-31 LAB — CERVICOVAGINAL ANCILLARY ONLY
Bacterial vaginitis: NEGATIVE
CANDIDA VAGINITIS: NEGATIVE
TRICH (WINDOWPATH): NEGATIVE

## 2017-08-20 ENCOUNTER — Ambulatory Visit: Payer: BC Managed Care – PPO | Admitting: Obstetrics and Gynecology

## 2017-08-20 ENCOUNTER — Telehealth: Payer: Self-pay | Admitting: Obstetrics and Gynecology

## 2017-08-20 NOTE — Telephone Encounter (Signed)
Patient has a few questions for a nurse regarding her vulvar biopsy procedure  08/24/17.

## 2017-08-20 NOTE — Telephone Encounter (Signed)
Spoke with patient, questions answered regarding vulvar biopsy procedure. Advised to take Motrin 800 mg with food and water one hour before procedure.  Routing to provider for final review. Patient is agreeable to disposition. Will close encounter.

## 2017-08-23 NOTE — Progress Notes (Signed)
  Subjective:     Patient ID: Katrina Wood, female   DOB: September 04, 1995, 22 y.o.   MRN: [id number]  HPI   Here for vulvar biopsy.   Has chronic vulvovaginitis.  Chronic itching.  Did get relief from Diflucan.  Allegra helps.  New lotion with coco butter makes it worse.   Uses Dove sensitive care.   Considering copper IUD.   Stopped Micronor.   Pap history n/a  Review of Systems  Constitutional: Negative.   HENT: Negative.   Eyes: Negative.   Respiratory: Negative.   Cardiovascular: Negative.   Gastrointestinal: Negative.   Endocrine: Negative.   Genitourinary: Negative.        Vaginal itching  Musculoskeletal: Negative.   Skin: Negative.   Allergic/Immunologic: Negative.   Neurological: Negative.   Hematological: Negative.   Psychiatric/Behavioral: Negative.   All other systems reviewed and are negative.  LMP:07/27/2017 Contraception:none WUJ:[id number]:negative    Objective:   Physical Exam   Vulvar biopsy. Consent for procedure.  Fissure of the left perineum.  Sterile prep with betadine. Local 1% lidocaine, lot [id number], exp 01/23. 4 mm punch biopsy taken and sent to path. Simple 3/0 Vicryl suture placed.  Minimal EBL.  No complications.      Assessment:     Chronic vulvitis.  Somewhat improved with Diflucan.  Perineal fissure. Contraceptive counseling.     Plan:     Discussion of chronic vulvitis.  FU biopsy.  Consider long term weekly course of Diflucan if biopsy supports yeast. Discussion of ParaGuard IUD risks and benefits.  She would like to precert this.   After visit summary to patient.  __15_____ minutes face to face time of which over 50% was spent in counseling.

## 2017-08-24 ENCOUNTER — Other Ambulatory Visit: Payer: Self-pay

## 2017-08-24 ENCOUNTER — Ambulatory Visit: Payer: BC Managed Care – PPO | Admitting: Obstetrics and Gynecology

## 2017-08-24 ENCOUNTER — Encounter: Payer: Self-pay | Admitting: Obstetrics and Gynecology

## 2017-08-24 VITALS — BP 104/60 | HR 70 | Resp 16 | Ht 65.0 in | Wt 133.0 lb

## 2017-08-24 DIAGNOSIS — Z01812 Encounter for preprocedural laboratory examination: Secondary | ICD-10-CM | POA: Diagnosis not present

## 2017-08-24 DIAGNOSIS — Z3009 Encounter for other general counseling and advice on contraception: Secondary | ICD-10-CM

## 2017-08-24 DIAGNOSIS — N760 Acute vaginitis: Secondary | ICD-10-CM

## 2017-08-24 LAB — POCT URINE PREGNANCY: PREG TEST UR: NEGATIVE

## 2017-08-24 NOTE — Patient Instructions (Signed)
Intrauterine Device Information An intrauterine device (IUD) is inserted into your uterus to prevent pregnancy. There are two types of IUDs available:  Copper IUD-This type of IUD is wrapped in copper wire and is placed inside the uterus. Copper makes the uterus and fallopian tubes produce a fluid that kills sperm. The copper IUD can stay in place for 10 years.  Hormone IUD-This type of IUD contains the hormone progestin (synthetic progesterone). The hormone thickens the cervical mucus and prevents sperm from entering the uterus. It also thins the uterine lining to prevent implantation of a fertilized egg. The hormone can weaken or kill the sperm that get into the uterus. One type of hormone IUD can stay in place for 5 years, and another type can stay in place for 3 years.  Your health care provider will make sure you are a good candidate for a contraceptive IUD. Discuss with your health care provider the possible side effects. Advantages of an intrauterine device  IUDs are highly effective, reversible, long acting, and low maintenance.  There are no estrogen-related side effects.  An IUD can be used when breastfeeding.  IUDs are not associated with weight gain.  The copper IUD works immediately after insertion.  The hormone IUD works right away if inserted within 7 days of your period starting. You will need to use a backup method of birth control for 7 days if the hormone IUD is inserted at any other time in your cycle.  The copper IUD does not interfere with your female hormones.  The hormone IUD can make heavy menstrual periods lighter and decrease cramping.  The hormone IUD can be used for 3 or 5 years.  The copper IUD can be used for 10 years. Disadvantages of an intrauterine device  The hormone IUD can be associated with irregular bleeding patterns.  The copper IUD can make your menstrual flow heavier and more painful.  You may experience cramping and vaginal bleeding after  insertion. This information is not intended to replace advice given to you by your health care provider. Make sure you discuss any questions you have with your health care provider. Document Released: 12/07/2003 Document Revised: 06/10/2015 Document Reviewed: 06/23/2012 Elsevier Interactive Patient Education  2017 Elsevier Inc. VULVAR BIOPSY POST-PROCEDURE INSTRUCTIONS  1. You may take Ibuprofen, Aleve or Tylenol for pain if needed.    2. You may have a small amount of spotting.  You should wear a mini pad for the next few days.  3. You may use some topical Neosporin ointment if you would like (over the counter is fine).  4. You need to call if you have redness around the biopsy site, if there is any unusual draining, if the bleeding is heavy, or if you are concerned.  5. Shower or bathe as normal  6. We will call you within one week with results or we will discuss the results at your follow-up appointment if needed.

## 2017-09-04 ENCOUNTER — Telehealth: Payer: Self-pay | Admitting: Emergency Medicine

## 2017-09-04 MED ORDER — CLOBETASOL PROPIONATE 0.05 % EX OINT: TOPICAL_OINTMENT | CUTANEOUS | 0 refills | Status: DC

## 2017-09-04 NOTE — Telephone Encounter (Signed)
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  This indicates she has a chronic itch and scratch cycle occurring. The pathologist questioned the possibility of verrucous psoriasis. I recommend she treat with clobetasol ointment 0.5% to vulva in a thin layer twice daily for 4 weeks.  Then have her return to the office for a recheck with me.

## 2017-09-04 NOTE — Telephone Encounter (Signed)
Pt returned call and notified of results per dr. Edward JollySilva.  Instructions given regarding use of Temovate ointment.  [facility] [place].  She will call back today to schedule follow up appointment in approximately 4 weeks with school schedule.   Encounter closed.

## 2017-09-04 NOTE — Telephone Encounter (Signed)
Message left to return call to Sanja Elizardo at [phone].    

## 2017-09-27 ENCOUNTER — Telehealth: Payer: Self-pay | Admitting: Obstetrics and Gynecology

## 2017-09-27 NOTE — Telephone Encounter (Signed)
Call placed to convey benefits for iud. °

## 2017-09-30 ENCOUNTER — Encounter: Payer: Self-pay | Admitting: Obstetrics and Gynecology

## 2017-10-01 ENCOUNTER — Telehealth: Payer: Self-pay | Admitting: Obstetrics and Gynecology

## 2017-10-01 MED ORDER — NORETHINDRONE ACET-ETHINYL EST 1-20 MG-MCG PO TABS: 1.0000 | ORAL_TABLET | Freq: Every day | ORAL | 1 refills | Status: DC

## 2017-10-01 NOTE — Telephone Encounter (Signed)
Ok for Lo-estrin 1/20, 3 packs and one refill.

## 2017-10-01 NOTE — Telephone Encounter (Signed)
Call to patient to verify pharmacy.  Instructions given for new start OCP. Verbalizes understanding and will follow up prn.  Encounter closed.

## 2017-10-01 NOTE — Telephone Encounter (Signed)
Hello, Dr. Edward JollySilva,  I have been thinking about my birth control options, and I think I'm going to go back on birth control instead of the IUD. Could you prescribe me one that is lower dose of hormones such as Loestrin 1/20?     Thanks,  [employer]CA IncShalini Kenagy

## 2017-10-01 NOTE — Telephone Encounter (Signed)
Routing to Dr. Silva to review and advise.  

## 2017-10-11 ENCOUNTER — Telehealth: Payer: Self-pay | Admitting: Obstetrics and Gynecology

## 2017-10-11 NOTE — Telephone Encounter (Signed)
Call placed to convey benefits. 

## 2017-11-02 ENCOUNTER — Encounter: Payer: Self-pay | Admitting: Physician Assistant

## 2017-11-02 ENCOUNTER — Ambulatory Visit: Payer: BC Managed Care – PPO | Admitting: Physician Assistant

## 2017-11-02 VITALS — BP 120/70 | HR 68 | Temp 98.7°F | Ht 65.0 in | Wt 136.5 lb

## 2017-11-02 DIAGNOSIS — R5383 Other fatigue: Secondary | ICD-10-CM | POA: Diagnosis not present

## 2017-11-02 LAB — POCT URINE PREGNANCY: PREG TEST UR: NEGATIVE

## 2017-11-02 LAB — COMPREHENSIVE METABOLIC PANEL
AG Ratio: 1.7 (calc) (ref 1.0–2.5)
ALT: 12 U/L (ref 5–32)
AST: 18 U/L (ref 12–32)
Albumin: 4.2 g/dL (ref 3.6–5.1)
Alkaline phosphatase (APISO): 65 U/L (ref 47–176)
BILIRUBIN TOTAL: 0.2 mg/dL (ref 0.2–1.1)
BUN: 14 mg/dL (ref 7–20)
CALCIUM: 9.4 mg/dL (ref 8.9–10.4)
CO2: 24 mmol/L (ref 20–32)
Chloride: 103 mmol/L (ref 98–110)
Creat: 0.69 mg/dL (ref 0.50–1.00)
Globulin: 2.5 g/dL (calc) (ref 2.0–3.8)
Glucose, Bld: 77 mg/dL (ref 65–99)
Potassium: 4.1 mmol/L (ref 3.8–5.1)
SODIUM: 138 mmol/L (ref 135–146)
TOTAL PROTEIN: 6.7 g/dL (ref 6.3–8.2)

## 2017-11-02 LAB — CBC WITH DIFFERENTIAL/PLATELET
BASOS ABS: 37 {cells}/uL (ref 0–200)
Basophils Relative: 0.4 %
EOS PCT: 2.2 %
Eosinophils Absolute: 202 cells/uL (ref 15–500)
HEMATOCRIT: 36 % (ref 35.0–45.0)
Hemoglobin: 12 g/dL (ref 11.7–15.5)
LYMPHS ABS: 3202 {cells}/uL (ref 850–3900)
MCH: 26.5 pg — ABNORMAL LOW (ref 27.0–33.0)
MCHC: 33.3 g/dL (ref 32.0–36.0)
MCV: 79.6 fL — ABNORMAL LOW (ref 80.0–100.0)
MONOS PCT: 9.1 %
MPV: 10 fL (ref 7.5–12.5)
NEUTROS PCT: 53.5 %
Neutro Abs: 4922 cells/uL (ref 1500–7800)
Platelets: 288 10*3/uL (ref 140–400)
RBC: 4.52 10*6/uL (ref 3.80–5.10)
RDW: 12.7 % (ref 11.0–15.0)
Total Lymphocyte: 34.8 %
WBC mixed population: 837 cells/uL (ref 200–950)
WBC: 9.2 10*3/uL (ref 3.8–10.8)

## 2017-11-02 LAB — TSH: TSH: 1.62 m[IU]/L

## 2017-11-02 LAB — T4, FREE: FREE T4: 1 ng/dL (ref 0.8–1.4)

## 2017-11-02 NOTE — Patient Instructions (Addendum)
It was great to see you!  We will send you a MyChart message with your lab results.  Good luck finishing up the semester!  Take care,  Jarold Motto PA-C

## 2017-11-02 NOTE — Progress Notes (Signed)
Katrina Wood is a 22 y.o. female here for a new problem.  I acted as a Neurosurgeon for [employer], PA-C Corky Mull, LPN  History of Present Illness:   Chief Complaint  Patient presents with  . Fatigue    HPI  Fatigue Pt c/o feeling tired past 4-5 days, runny nose, eyes dry, sore eyes. Sensation of pins and needles in jaw. Pt's mother concerned due to strong family hx of hypothyroidism. Wants labs checked. Several women in family have thyroid issues. Sleeping at least 7 hours per night. No fever.  Appetite is normal. Denies excessive alcohol.  Periods are not heavy. Took PlanB on September 20th, has not had period since and is about 9 days late. Has not taken a urine pregnancy test. Does have increase in bowel frequency -- Type 5 Bristol Stool chart.  Denies: CP, SOB, mood changes  Wt Readings from Last 5 Encounters:  11/02/17 136 lb 8 oz (61.9 kg) (64 %, Z= 0.36)*  08/24/17 133 lb (60.3 kg) (59 %, Z= 0.23)*  07/27/17 136 lb (61.7 kg) (64 %, Z= 0.36)*  07/18/17 133 lb 4 oz (60.4 kg) (60 %, Z= 0.25)*  03/30/17 133 lb (60.3 kg) (61 %, Z= 0.27)*   * Growth percentiles are based on CDC (Girls, 2-20 Years) data.     Patient's last menstrual period was 09/25/2017.    History reviewed. No pertinent past medical history.   Social History   Socioeconomic History  . Marital status: Single    Spouse name: Not on file  . Number of children: Not on file  . Years of education: Not on file  . Highest education level: Not on file  Occupational History  . Not on file  Social Needs  . Financial resource strain: Not on file  . Food insecurity:    Worry: Not on file    Inability: Not on file  . Transportation needs:    Medical: Not on file    Non-medical: Not on file  Tobacco Use  . Smoking status: Never Smoker  . Smokeless tobacco: Never Used  Substance and Sexual Activity  . Alcohol use: No  . Drug use: No  . Sexual activity: Yes    Birth control/protection: Condom,  Pill  Lifestyle  . Physical activity:    Days per week: Not on file    Minutes per session: Not on file  . Stress: Not on file  Relationships  . Social connections:    Talks on phone: Not on file    Gets together: Not on file    Attends religious service: Not on file    Active member of club or organization: Not on file    Attends meetings of clubs or organizations: Not on file    Relationship status: Not on file  . Intimate partner violence:    Fear of current or ex partner: Not on file    Emotionally abused: Not on file    Physically abused: Not on file    Forced sexual activity: Not on file  Other Topics Concern  . Not on file  Social History Narrative   Works at [employer], just finished Freshman Year   Studying [employer]   Roommate at college   Currently single    History reviewed. No pertinent surgical history.  Family History  Problem Relation Age of Onset  . Hyperlipidemia Mother   . Hypertension Mother   . Asthma Sister   .  Hyperlipidemia Maternal Grandfather   . Hypertension Maternal Grandfather   . Heart attack Maternal Grandfather   . Stroke Paternal Grandfather     No Known Allergies  Current Medications:   Current Outpatient Medications:  .  BIOTIN PO, Take 1 tablet by mouth daily., Disp: , Rfl:  .  cetirizine (ZYRTEC) 10 MG tablet, Take 10 mg by mouth daily., Disp: , Rfl:  .  clobetasol ointment (TEMOVATE) 0.05 %, Apply thin layer as directed to skin two times per day for 4 weeks., Disp: 60 g, Rfl: 0 .  norethindrone-ethinyl estradiol (LOESTRIN 1/20, 21,) 1-20 MG-MCG tablet, Take 1 tablet by mouth daily. (Patient not taking: Reported on 11/02/2017), Disp: 3 Package, Rfl: 1 .  ofloxacin (OCUFLOX) 0.3 % ophthalmic solution, , Disp: , Rfl:    Review of Systems:   ROS Negative unless otherwise specified per HPI.  Vitals:   Vitals:   11/02/17 1454  BP: 120/70  Pulse: 68  Temp: 98.7 F (37.1 C)  TempSrc:  Oral  SpO2: 99%  Weight: 136 lb 8 oz (61.9 kg)  Height: 5\' 5"  (1.651 m)     Body mass index is 22.71 kg/m.  Physical Exam:   Physical Exam  Constitutional: She appears well-developed. She is cooperative.  Non-toxic appearance. She does not have a sickly appearance. She does not appear ill. No distress.  HENT:  Head: Normocephalic and atraumatic.  Right Ear: Tympanic membrane, external ear and ear canal normal. Tympanic membrane is not erythematous, not retracted and not bulging.  Left Ear: Tympanic membrane, external ear and ear canal normal. Tympanic membrane is not erythematous, not retracted and not bulging.  Nose: Nose normal. Right sinus exhibits no maxillary sinus tenderness and no frontal sinus tenderness. Left sinus exhibits no maxillary sinus tenderness and no frontal sinus tenderness.  Mouth/Throat: Uvula is midline. No posterior oropharyngeal edema or posterior oropharyngeal erythema.  Eyes: Conjunctivae and lids are normal.  Neck: Trachea normal.  Cardiovascular: Normal rate, regular rhythm, S1 normal, S2 normal and normal heart sounds.  Pulmonary/Chest: Effort normal and breath sounds normal. She has no decreased breath sounds. She has no wheezes. She has no rhonchi. She has no rales.  Abdominal: Normal appearance and bowel sounds are normal. There is no tenderness. There is no rigidity, no rebound, no guarding and no CVA tenderness.  Lymphadenopathy:    She has no cervical adenopathy.  Neurological: She is alert.  Skin: Skin is warm, dry and intact.  Psychiatric: She has a normal mood and affect. Her speech is normal and behavior is normal.  Nursing note and vitals reviewed.   Results for orders placed or performed in visit on 11/02/17  POCT urine pregnancy  Result Value Ref Range   Preg Test, Ur Negative Negative    Assessment and Plan:    Mahitha was seen today for fatigue.  Diagnoses and all orders for this visit:  Fatigue, unspecified type -     POCT  urine pregnancy -     Urinalysis, Routine w reflex microscopic -     CBC with Differential/Platelet -     Comprehensive metabolic panel -     TSH -     T4, free   Urine pregnancy negative. She does endorse some anxiety about potentially being pregnant over the past week or so and states that she has been "thinking about it daily." Suspect that some of these symptoms could be attributed to this anxiety. She does have a strong family history of hypothyroidism and  I discussed with her that I would be happy to check those labs as well as her Hgb. Follow-up if symptoms worsen or persist despite treatment. Push fluids and rest.   . Reviewed expectations re: course of current medical issues. . Discussed self-management of symptoms. . Outlined signs and symptoms indicating need for more acute intervention. . Patient verbalized understanding and all questions were answered. . See orders for this visit as documented in the electronic medical record. . Patient received an After-Visit Summary.  CMA or LPN served as scribe during this visit. History, Physical, and Plan performed by medical provider. The above documentation has been reviewed and is accurate and complete.   Jarold Motto, PA-C

## 2017-11-04 LAB — URINALYSIS, ROUTINE W REFLEX MICROSCOPIC
BILIRUBIN URINE: NEGATIVE
Glucose, UA: NEGATIVE
HYALINE CAST: NONE SEEN /LPF
Hgb urine dipstick: NEGATIVE
KETONES UR: NEGATIVE
Nitrite: NEGATIVE
Protein, ur: NEGATIVE
RBC / HPF: NONE SEEN /HPF (ref 0–2)
SPECIFIC GRAVITY, URINE: 1.009 (ref 1.001–1.03)
pH: 7 (ref 5.0–8.0)

## 2017-11-04 LAB — URINE CULTURE
MICRO NUMBER: 91255636
SPECIMEN QUALITY: ADEQUATE

## 2017-11-05 ENCOUNTER — Other Ambulatory Visit: Payer: Self-pay | Admitting: Physician Assistant

## 2017-11-05 MED ORDER — AMOXICILLIN-POT CLAVULANATE 875-125 MG PO TABS: 1.0000 | ORAL_TABLET | Freq: Two times a day (BID) | ORAL | 0 refills | Status: DC

## 2017-11-07 ENCOUNTER — Telehealth: Payer: Self-pay | Admitting: Physician Assistant

## 2017-11-07 NOTE — Telephone Encounter (Signed)
Copied from CRM [id number]. Topic: General - Inquiry >> Nov 07, 2017  9:48 AM Maia Petties wrote: Reason for CRM: pt called with concern about UTI results - she looked up the bacteria causing it and it's rare. She is asking if she should be tested for STIs. She is asking if the ABX sent would cover STI if that's a possibility. Anytime before 3:45pm is good. Please call back.

## 2017-11-07 NOTE — Telephone Encounter (Signed)
See note

## 2017-11-07 NOTE — Telephone Encounter (Signed)
Spoke with pt to advise that antibiotic was that was sent to [facility] was for UTI not STDs. If pt is concerned about STDs then she will need to be re-evaluated. Pt stated since she goes to school in [place] that she will go to student health center to be seen.

## 2017-11-08 NOTE — Telephone Encounter (Signed)
Call placed to convey benefits. 

## 2017-11-13 NOTE — Telephone Encounter (Signed)
Patient called [facility] after hours and "Caller stated she has a UTI, she was prescribed amoxicillin and her symptoms are not getting better. She has more pain on her bladder andrea and frequent urination." per [facility]  [facility] called patient back to discuss and left a message. Patient did not answer.

## 2017-11-14 NOTE — Telephone Encounter (Signed)
LVM informing pt per Lelon Mast, she should be seen at her [facility] doctor's office.

## 2017-11-22 ENCOUNTER — Telehealth: Payer: Self-pay | Admitting: Obstetrics and Gynecology

## 2017-11-22 NOTE — Telephone Encounter (Signed)
Call placed to convey benefits for IUD. °

## 2017-12-12 ENCOUNTER — Other Ambulatory Visit (HOSPITAL_COMMUNITY)
Admission: RE | Admit: 2017-12-12 | Discharge: 2017-12-12 | Disposition: A | Discharge status: Home / Self Care | Payer: BC Managed Care – PPO | Source: Ambulatory Visit | Attending: Physician Assistant | Admitting: Physician Assistant

## 2017-12-12 ENCOUNTER — Encounter: Payer: Self-pay | Admitting: Physician Assistant

## 2017-12-12 ENCOUNTER — Ambulatory Visit: Payer: BC Managed Care – PPO | Admitting: Physician Assistant

## 2017-12-12 VITALS — BP 104/70 | HR 73 | Temp 99.2°F | Ht 65.0 in | Wt 139.0 lb

## 2017-12-12 DIAGNOSIS — N760 Acute vaginitis: Secondary | ICD-10-CM | POA: Diagnosis not present

## 2017-12-12 DIAGNOSIS — R350 Frequency of micturition: Secondary | ICD-10-CM

## 2017-12-12 LAB — POC URINALSYSI DIPSTICK (AUTOMATED)
Bilirubin, UA: NEGATIVE
Blood, UA: NEGATIVE
Glucose, UA: NEGATIVE
KETONES UA: NEGATIVE
Nitrite, UA: NEGATIVE
PROTEIN UA: NEGATIVE
Spec Grav, UA: 1.015 (ref 1.010–1.025)
Urobilinogen, UA: 0.2 E.U./dL
pH, UA: 7 (ref 5.0–8.0)

## 2017-12-12 MED ORDER — CEPHALEXIN 500 MG PO CAPS: 500.0000 mg | ORAL_CAPSULE | Freq: Two times a day (BID) | ORAL | 0 refills | Status: AC

## 2017-12-12 NOTE — Patient Instructions (Signed)
It was great to see you.  If you develop concerns for UTI or kidney infection, start keflex.  Otherwise we will be in touch as soon as we get our swabs and culture back.  Have a great birthday!

## 2017-12-13 LAB — URINE CULTURE
MICRO NUMBER: 91430960
RESULT: NO GROWTH
SPECIMEN QUALITY: ADEQUATE

## 2017-12-14 ENCOUNTER — Encounter: Payer: Self-pay | Admitting: Physician Assistant

## 2017-12-14 NOTE — Progress Notes (Signed)
Katrina Katrina Wood Katrina Katrina Wood Katrina Wood is a 22 y.o. female here for a recurrence of a previously resolved problem.  History of Present Illness:   Chief Complaint  Patient presents with  . Urinary Frequency    itching,abdominal pain,odor and discharge x 1 month    HPI   Patient presents with a 1 month history of urinary discomfort, vaginal pain, vaginal odor and discharge, and vaginal itching.  She has recently gone to her student health clinic and was told that she had a UTI, and she was given the amount of Keflex which she completed.  She feels as though her symptoms never completely gone away.  She states that they were going to test for BV but they never did.  She suspects that she may have this today.  She states that she is currently not sexually active.  She has not tried anything over-the-counter for her symptoms.  She denies -vaginal bleeding, pain with intercourse, changes in bowel movements, fever, chills, exquisite lower abdominal pain.  Patient's last menstrual period was 11/24/2017 (exact date).   History reviewed. No pertinent past medical history.   Social History   Socioeconomic History  . Marital status: Single    Spouse name: Not on file  . Number of children: Not on file  . Years of education: Not on file  . Highest education level: Not on file  Occupational History  . Not on file  Social Needs  . Financial resource strain: Not on file  . Food insecurity:    Worry: Not on file    Inability: Not on file  . Transportation needs:    Medical: Not on file    Non-medical: Not on file  Tobacco Use  . Smoking status: Never Smoker  . Smokeless tobacco: Never Used  Substance and Sexual Activity  . Alcohol use: No  . Drug use: No  . Sexual activity: Yes    Birth control/protection: Condom, Pill  Lifestyle  . Physical activity:    Days per week: Not on file    Minutes per session: Not on file  . Stress: Not on file  Relationships  . Social connections:    Talks on phone: Not on  file    Gets together: Not on file    Attends religious service: Not on file    Active member of club or organization: Not on file    Attends meetings of clubs or organizations: Not on file    Relationship status: Not on file  . Intimate partner violence:    Fear of current or ex partner: Not on file    Emotionally abused: Not on file    Physically abused: Not on file    Forced sexual activity: Not on file  Other Topics Concern  . Not on file  Social History Narrative   Works at [employer]   [employer], just finished Freshman Year   Studying [employer]   Roommate at college   Currently single    History reviewed. No pertinent surgical history.  Family History  Problem Relation Age of Onset  . Hyperlipidemia Mother   . Hypertension Mother   . Asthma Sister   . Hyperlipidemia Maternal Grandfather   . Hypertension Maternal Grandfather   . Heart attack Maternal Grandfather   . Stroke Paternal Grandfather     No Known Allergies  Current Medications:   Current Outpatient Medications:  .  BIOTIN PO, Take 1 tablet by mouth daily., Disp: , Rfl:  .  cephALEXin (  KEFLEX) 500 MG capsule, Take 1 capsule (500 mg total) by mouth 2 (two) times daily for 5 days., Disp: 10 capsule, Rfl: 0 .  cetirizine (ZYRTEC) 10 MG tablet, Take 10 mg by mouth daily., Disp: , Rfl:  .  clobetasol ointment (TEMOVATE) 0.05 %, Apply thin layer as directed to skin two times per day for 4 weeks., Disp: 60 g, Rfl: 0 .  norethindrone-ethinyl estradiol (LOESTRIN 1/20, 21,) 1-20 MG-MCG tablet, Take 1 tablet by mouth daily. (Patient not taking: Reported on 11/02/2017), Disp: 3 Package, Rfl: 1   Review of Systems:   ROS  Negative unless otherwise specified per HPI.  Vitals:   Vitals:   12/12/17 1558  BP: 104/70  Pulse: 73  Temp: 99.2 F (37.3 C)  TempSrc: Oral  SpO2: 99%  Weight: 139 lb (63 kg)  Height: 5\' 5"  (1.651 m)     Body mass index is 23.13 kg/m.  Physical Exam:    Physical Exam  Constitutional: She appears well-developed. She is cooperative.  Non-toxic appearance. She does not have a sickly appearance. She does not appear ill. No distress.  Cardiovascular: Normal rate, regular rhythm, S1 normal, S2 normal, normal heart sounds and normal pulses.  No LE edema  Pulmonary/Chest: Effort normal and breath sounds normal.  Genitourinary: There is no rash, tenderness or lesion on the right labia. There is no rash, tenderness or lesion on the left labia. Uterus is not tender. Cervix exhibits no motion tenderness, no discharge and no friability. There is tenderness in the vagina. No erythema in the vagina. No foreign body in the vagina. Vaginal discharge (scant thin, white discharge) found.  Neurological: She is alert. GCS eye subscore is 4. GCS verbal subscore is 5. GCS motor subscore is 6.  Skin: Skin is warm, dry and intact.  Psychiatric: She has a normal mood and affect. Her speech is normal and behavior is normal.  Nursing note and vitals reviewed.   Results for orders placed or performed in visit on 12/12/17  Urine Culture  Result Value Ref Range   MICRO NUMBER: [id number]    SPECIMEN QUALITY: Adequate    Sample Source NOT GIVEN    STATUS: FINAL    Result: No Growth   POCT Urinalysis Dipstick (Automated)  Result Value Ref Range   Color, UA Yellow    Clarity, UA Clear    Glucose, UA Negative Negative   Bilirubin, UA Negative    Ketones, UA Negative    Spec Grav, UA 1.015 1.010 - 1.025   Blood, UA Negative    pH, UA 7.0 5.0 - 8.0   Protein, UA Negative Negative   Urobilinogen, UA 0.2 0.2 or 1.0 E.U./dL   Nitrite, UA Negative    Leukocytes, UA Small (1+) (A) Negative    Assessment and Plan:   Katrina Katrina Wood was seen today for urinary frequency.  Diagnoses and all orders for this visit:  Frequent urination -     POCT Urinalysis Dipstick (Automated) -     Urine Culture  Acute vaginitis -     Cervicovaginal ancillary only( [place]);  Future -     Cervicovaginal ancillary only( [place])  Other orders -     cephALEXin (KEFLEX) 500 MG capsule; Take 1 capsule (500 mg total) by mouth 2 (two) times daily for 5 days.    Urine shows small bacteria.  Urine culture was negative.  I informed patient of these results.  I do suspect that she has bacterial vaginitis, however she would  like to wait to start a round of antibiotics, i.e. Flagyl, until results have been confirmed.  I am agreeable with this.  I discussed possibility of doing STD testing however patient declined.  Will await vaginal swab results and send in medication as needed based on results.  Follow-up with [place] or student health if symptoms persist despite treatment.  Patient verbalized understanding to plan.  . Reviewed expectations re: course of current medical issues. . Discussed self-management of symptoms. . Outlined signs and symptoms indicating need for more acute intervention. . Patient verbalized understanding and all questions were answered. . See orders for this visit as documented in the electronic medical record. . Patient received an After-Visit Summary.  Jarold Motto, PA-C

## 2017-12-18 ENCOUNTER — Other Ambulatory Visit: Payer: Self-pay | Admitting: Physician Assistant

## 2017-12-18 LAB — CERVICOVAGINAL ANCILLARY ONLY
Bacterial vaginitis: NEGATIVE
Candida vaginitis: POSITIVE — AB

## 2017-12-18 MED ORDER — FLUCONAZOLE 150 MG PO TABS: 150.0000 mg | ORAL_TABLET | Freq: Once | ORAL | 0 refills | Status: AC

## 2018-02-14 ENCOUNTER — Other Ambulatory Visit: Payer: Self-pay | Admitting: Obstetrics and Gynecology

## 2018-02-14 NOTE — Telephone Encounter (Signed)
Medication refill request: JUNEL FE  Last OV:  08/24/17 Vulvovaginitis  Next AEX: none scheduled Last MMG (if hormonal medication request): n/a  Refill authorized: 10/01/17 #3 w/1 refills; today please advise; Order pended for #1pack w/0 refills if authorized. Patient is due for AEX. Tried calling patient, no answer, left message to call our office back to schedule AEX.

## 2018-03-17 ENCOUNTER — Other Ambulatory Visit: Payer: Self-pay | Admitting: Obstetrics and Gynecology

## 2018-04-01 ENCOUNTER — Ambulatory Visit: Payer: BC Managed Care – PPO | Admitting: Physician Assistant

## 2018-04-01 ENCOUNTER — Other Ambulatory Visit: Payer: Self-pay

## 2018-04-01 ENCOUNTER — Encounter: Payer: Self-pay | Admitting: Physician Assistant

## 2018-04-01 VITALS — BP 108/62 | HR 106 | Temp 98.5°F | Ht 65.0 in | Wt 140.0 lb

## 2018-04-01 DIAGNOSIS — R300 Dysuria: Secondary | ICD-10-CM

## 2018-04-01 DIAGNOSIS — J029 Acute pharyngitis, unspecified: Secondary | ICD-10-CM | POA: Diagnosis not present

## 2018-04-01 LAB — POCT URINALYSIS DIPSTICK
Bilirubin, UA: NEGATIVE
Blood, UA: NEGATIVE
Glucose, UA: NEGATIVE
Ketones, UA: NEGATIVE
Leukocytes, UA: NEGATIVE
Nitrite, UA: NEGATIVE
Protein, UA: NEGATIVE
Spec Grav, UA: 1.015 (ref 1.010–1.025)
Urobilinogen, UA: 0.2 E.U./dL
pH, UA: 6.5 (ref 5.0–8.0)

## 2018-04-01 LAB — POCT RAPID STREP A (OFFICE): Rapid Strep A Screen: NEGATIVE

## 2018-04-01 NOTE — Patient Instructions (Signed)
It was great to see you!  You have a viral upper respiratory infection. Antibiotics are not needed for this.  Viral infections usually take 7-10 days to resolve.  The cough can last a few weeks to go away.  Your urine swab was negative too.  Push fluids and get plenty of rest. Please return if you are not improving as expected, or if you have high fevers (>101.5) or difficulty swallowing or worsening productive cough.  Call clinic with questions.  I hope you start feeling better soon!

## 2018-04-01 NOTE — Progress Notes (Signed)
Katrina Wood is a 23 y.o. female here for a follow up of a pre-existing problem.  I acted as a Neurosurgeon for [employer], PA-C Corky Mull, LPN   History of Present Illness:   Chief Complaint  Patient presents with  . Sore Throat    HPI   Spring Break in [place], returned Thursday. Symptoms: sore throat, congestion. Denies: fever, chills, SOB, cough, diarrhea. She is improving significantly with time.  She is also here to discuss UTI symptoms. She is having issues with dysuria, sometimes frequency and pelvic pressure while peeing. Denies hematuria. Is not currently sexually active.   History reviewed. No pertinent past medical history.   Social History   Socioeconomic History  . Marital status: Single    Spouse name: Not on file  . Number of children: Not on file  . Years of education: Not on file  . Highest education level: Not on file  Occupational History  . Not on file  Social Needs  . Financial resource strain: Not on file  . Food insecurity:    Worry: Not on file    Inability: Not on file  . Transportation needs:    Medical: Not on file    Non-medical: Not on file  Tobacco Use  . Smoking status: Never Smoker  . Smokeless tobacco: Never Used  Substance and Sexual Activity  . Alcohol use: No  . Drug use: No  . Sexual activity: Yes    Birth control/protection: Condom, Pill  Lifestyle  . Physical activity:    Days per week: Not on file    Minutes per session: Not on file  . Stress: Not on file  Relationships  . Social connections:    Talks on phone: Not on file    Gets together: Not on file    Attends religious service: Not on file    Active member of club or organization: Not on file    Attends meetings of clubs or organizations: Not on file    Relationship status: Not on file  . Intimate partner violence:    Fear of current or ex partner: Not on file    Emotionally abused: Not on file    Physically abused: Not on file    Forced sexual  activity: Not on file  Other Topics Concern  . Not on file  Social History Narrative   Works at [employer], just finished Freshman Year   Studying [employer]   Roommate at college   Currently single    History reviewed. No pertinent surgical history.  Family History  Problem Relation Age of Onset  . Hyperlipidemia Mother   . Hypertension Mother   . Asthma Sister   . Hyperlipidemia Maternal Grandfather   . Hypertension Maternal Grandfather   . Heart attack Maternal Grandfather   . Stroke Paternal Grandfather     No Known Allergies  Current Medications:   Current Outpatient Medications:  .  BIOTIN PO, Take 1 tablet by mouth daily., Disp: , Rfl:  .  cetirizine (ZYRTEC) 10 MG tablet, Take 10 mg by mouth daily., Disp: , Rfl:  .  clobetasol ointment (TEMOVATE) 0.05 %, Apply thin layer as directed to skin two times per day for 4 weeks., Disp: 60 g, Rfl: 0 .  JUNEL 1/20 1-20 MG-MCG tablet, TAKE 1 TABLET BY MOUTH EVERY DAY, Disp: 28 tablet, Rfl: 0   Review of Systems:   Review of Systems  Constitutional: Negative for chills, fever,  malaise/fatigue and weight loss.  HENT: Positive for congestion and sore throat.   Respiratory: Positive for cough. Negative for shortness of breath.   Cardiovascular: Negative for chest pain, orthopnea, claudication and leg swelling.  Gastrointestinal: Negative for heartburn, nausea and vomiting.  Neurological: Negative for dizziness, tingling and headaches.     Vitals:   Vitals:   04/01/18 1502  BP: 108/62  Pulse: (!) 106  Temp: 98.5 F (36.9 C)  TempSrc: Oral  Weight: 140 lb (63.5 kg)  Height: 5\' 5"  (1.651 m)     Body mass index is 23.3 kg/m.  Physical Exam:   Physical Exam Vitals signs and nursing note reviewed.  Constitutional:      General: She is not in acute distress.    Appearance: She is well-developed. She is not ill-appearing or toxic-appearing.  HENT:     Head: Normocephalic and  atraumatic.     Right Ear: Tympanic membrane, ear canal and external ear normal. Tympanic membrane is not erythematous, retracted or bulging.     Left Ear: Tympanic membrane, ear canal and external ear normal. Tympanic membrane is not erythematous, retracted or bulging.     Nose: Nose normal.     Right Sinus: No maxillary sinus tenderness or frontal sinus tenderness.     Left Sinus: No maxillary sinus tenderness or frontal sinus tenderness.     Mouth/Throat:     Lips: Pink.     Mouth: Mucous membranes are moist.     Pharynx: Uvula midline. Posterior oropharyngeal erythema present.     Tonsils: No tonsillar exudate. Swelling: 1+ on the right. 1+ on the left.  Eyes:     General: Lids are normal.     Conjunctiva/sclera: Conjunctivae normal.  Neck:     Trachea: Trachea normal.  Cardiovascular:     Rate and Rhythm: Normal rate and regular rhythm.     Heart sounds: Normal heart sounds, S1 normal and S2 normal.  Pulmonary:     Effort: Pulmonary effort is normal.     Breath sounds: Normal breath sounds. No decreased breath sounds, wheezing, rhonchi or rales.  Abdominal:     General: Abdomen is flat. Bowel sounds are normal.     Palpations: Abdomen is soft.     Tenderness: There is no abdominal tenderness.  Lymphadenopathy:     Cervical: No cervical adenopathy.  Skin:    General: Skin is warm and dry.  Neurological:     Mental Status: She is alert.  Psychiatric:        Speech: Speech normal.        Behavior: Behavior normal. Behavior is cooperative.     Results for orders placed or performed in visit on 04/01/18  POCT urinalysis dipstick  Result Value Ref Range   Color, UA Yellow    Clarity, UA Clear    Glucose, UA Negative Negative   Bilirubin, UA Negative    Ketones, UA Negative    Spec Grav, UA 1.015 1.010 - 1.025   Blood, UA Negative    pH, UA 6.5 5.0 - 8.0   Protein, UA Negative Negative   Urobilinogen, UA 0.2 0.2 or 1.0 E.U./dL   Nitrite, UA Negative    Leukocytes, UA  Negative Negative   Appearance     Odor    POCT rapid strep A  Result Value Ref Range   Rapid Strep A Screen Negative Negative    Assessment and Plan:   Rainbow was seen today for sore throat.  Diagnoses and  all orders for this visit:  Sore throat No red flags on exam. Strep test negative. Suspect viral URI.  Reviewed return precautions including worsening fever, SOB, worsening cough or other concerns. Push fluids and rest. I recommend that patient follow-up if symptoms worsen or persist despite treatment x 7-10 days, sooner if needed. -     POCT rapid strep A  Dysuria UA is normal. Ongoing issues over the past few months. Will refer to urology. -     POCT urinalysis dipstick -     Urine Culture  . Reviewed expectations re: course of current medical issues. . Discussed self-management of symptoms. . Outlined signs and symptoms indicating need for more acute intervention. . Patient verbalized understanding and all questions were answered. . See orders for this visit as documented in the electronic medical record. . Patient received an After-Visit Summary.   Jarold Motto, PA-C

## 2018-04-02 LAB — URINE CULTURE
MICRO NUMBER: 323895
SPECIMEN QUALITY:: ADEQUATE

## 2018-05-20 ENCOUNTER — Other Ambulatory Visit: Payer: Self-pay

## 2018-05-20 NOTE — Telephone Encounter (Signed)
Medication refill request: junel fe 1/20 Last OV  08/24/17 vulvovaginitis Next AEX: not scheduled. Note placed for pharmacy to let patient know she is due & will need to schedule an aex soon. Last MMG (if hormonal medication request): none Refill authorized: please approve if appropriate.

## 2018-05-21 MED ORDER — NORETHINDRONE ACET-ETHINYL EST 1-20 MG-MCG PO TABS: 1.0000 | ORAL_TABLET | Freq: Every day | ORAL | 1 refills | Status: DC

## 2018-06-27 ENCOUNTER — Telehealth: Payer: BC Managed Care – PPO | Admitting: Physician Assistant

## 2018-06-27 DIAGNOSIS — M549 Dorsalgia, unspecified: Secondary | ICD-10-CM

## 2018-06-27 NOTE — Progress Notes (Signed)
Based on what you shared with me, I feel your condition warrants further evaluation and I recommend that you be seen for a face to face office visit with your primary care provider. They may also consider doing this via video visit if you are hesitant to go into the office currently.   Giving that symptoms have been present for more than 4 weeks, you really need good examination of your back and spine, as well as potential imaging to make sure nothing more significant is going on. This is above what we are allowed to treat via e-visit.      NOTE: If you entered your credit card information for this eVisit, you will not be charged. You may see a "hold" on your card for the $35 but that hold will drop off and you will not have a charge processed.  If you are having a true medical emergency please call 911.  If you need an urgent face to face visit, [facility] has four urgent care centers for your convenience.      [email or web address] to reserve your spot online an avoid wait times  [facility] [address] [place], [place] [address] Modified hours of operation: Monday-Friday, 12 PM to 6 PM  Saturday & Sunday 10 AM to 4 PM *Across the street from [place] (New Address!) [address] [place], [place] [address] *Just off [address], across the road from [place] hours of operation: Monday-Friday, 12 PM to 6 PM  Closed Saturday & Sunday   The following sites will take your insurance:  . [facility] [place] a Provider at this Location  [address] [place], [place] [address] . 10 am to 8 pm Monday-Friday . 12 pm to 8 pm Saturday-Sunday   . [facility] [place] a Provider at this Location  [address], [place], [place] [address] . 8 am to 8 pm  Monday-Friday . 9 am to 6 pm Saturday . 11 am to 6 pm Sunday   . [facility] at [place] Get Driving Directions  [address] [place],  [address] . 8 am to 8 pm Monday-Friday . 8 am to 4 pm Saturday-Sunday   Your e-visit answers were reviewed by a board certified advanced clinical practitioner to complete your personal care plan.  Thank you for using e-Visits.

## 2018-06-28 ENCOUNTER — Other Ambulatory Visit: Payer: Self-pay

## 2018-06-28 ENCOUNTER — Ambulatory Visit: Payer: BC Managed Care – PPO | Admitting: Physician Assistant

## 2018-06-28 ENCOUNTER — Ambulatory Visit (INDEPENDENT_AMBULATORY_CARE_PROVIDER_SITE_OTHER): Payer: BC Managed Care – PPO

## 2018-06-28 ENCOUNTER — Encounter: Payer: Self-pay | Admitting: Physician Assistant

## 2018-06-28 VITALS — BP 120/70 | HR 79 | Temp 98.9°F | Ht 65.0 in | Wt 142.2 lb

## 2018-06-28 DIAGNOSIS — M542 Cervicalgia: Secondary | ICD-10-CM | POA: Diagnosis not present

## 2018-06-28 DIAGNOSIS — M549 Dorsalgia, unspecified: Secondary | ICD-10-CM | POA: Diagnosis not present

## 2018-06-28 MED ORDER — CYCLOBENZAPRINE HCL 5 MG PO TABS: 5.0000 mg | ORAL_TABLET | Freq: Three times a day (TID) | ORAL | 0 refills | Status: DC | PRN

## 2018-06-28 MED ORDER — MELOXICAM 15 MG PO TABS: 15.0000 mg | ORAL_TABLET | Freq: Every day | ORAL | 0 refills | Status: DC

## 2018-06-28 NOTE — Progress Notes (Signed)
Katrina Wood is a 23 y.o. female here for a new problem.  I acted as a Education administrator for [employer], PA-C Anselmo Pickler, LPN  History of Present Illness:   Chief Complaint  Patient presents with  . Neck and back pain    HPI   Pt c/o neck and back pain x 1 year but has gotten worse in the past month. Pt says having cramping in upper back and shoulders and spasms that sometimes radiates to her ears. Some low back pain too. Taking Ibuprofen 400 mg every 5 hours no relief. 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 Pt is also using heating pad and Tiger balm with some relief. Her cousin had similar neck pain recently and when xray was performed they found a curvature -- patient is concerned that she may be dealing with this as well -- wants xray to see if that is an issue.  She denies: numbness, tingling, unusual weight loss, possibility of pregnancy, any urinary issues  History reviewed. No pertinent past medical history.   Social History   Socioeconomic History  . Marital status: Single    Spouse name: Not on file  . Number of children: Not on file  . Years of education: Not on file  . Highest education level: Not on file  Occupational History  . Not on file  Social Needs  . Financial resource strain: Not on file  . Food insecurity    Worry: Not on file    Inability: Not on file  . Transportation needs    Medical: Not on file    Non-medical: Not on file  Tobacco Use  . Smoking status: Never Smoker  . Smokeless tobacco: Never Used  Substance and Sexual Activity  . Alcohol use: No  . Drug use: No  . Sexual activity: Yes    Birth control/protection: Condom, Pill  Lifestyle  . Physical activity    Days per week: Not on file    Minutes per session: Not on file  . Stress: Not on file  Relationships  . Social Herbalist on phone: Not on file    Gets together: Not on file    Attends religious service: Not on  file    Active member of club or organization: Not on file    Attends meetings of clubs or organizations: Not on file    Relationship status: Not on file  . Intimate partner violence    Fear of current or ex partner: Not on file    Emotionally abused: Not on file    Physically abused: Not on file    Forced sexual activity: Not on file  Other Topics Concern  . Not on file  Social History Narrative   Works at [employer], just finished [place] at college   Currently single    History reviewed. No pertinent surgical history.  Family History  Problem Relation Age of Onset  . Hyperlipidemia Mother   . Hypertension Mother   . Asthma Sister   . Hyperlipidemia Maternal Grandfather   . Hypertension Maternal Grandfather   . Heart attack Maternal Grandfather   . Stroke Paternal Grandfather     No Known Allergies  Current Medications:   Current Outpatient Medications:  .  BIOTIN PO, Take 1 tablet by mouth daily., Disp: , Rfl:  .  cetirizine (ZYRTEC)  10 MG tablet, Take 10 mg by mouth daily., Disp: , Rfl:  .  clobetasol ointment (TEMOVATE) 0.05 %, Apply thin layer as directed to skin two times per day for 4 weeks., Disp: 60 g, Rfl: 0 .  norethindrone-ethinyl estradiol (JUNEL 1/20) 1-20 MG-MCG tablet, Take 1 tablet by mouth daily., Disp: 28 tablet, Rfl: 1 .  cyclobenzaprine (FLEXERIL) 5 MG tablet, Take 1 tablet (5 mg total) by mouth 3 (three) times daily as needed for muscle spasms., Disp: 20 tablet, Rfl: 0 .  meloxicam (MOBIC) 15 MG tablet, Take 1 tablet (15 mg total) by mouth daily., Disp: 30 tablet, Rfl: 0   Review of Systems:   Review of Systems  Constitutional: Negative for chills, fever, malaise/fatigue and weight loss.  Respiratory: Negative for shortness of breath.   Cardiovascular: Negative for chest pain, orthopnea, claudication and leg swelling.  Gastrointestinal: Negative for heartburn, nausea  and vomiting.  Musculoskeletal: Positive for back pain, myalgias and neck pain.  Neurological: Negative for dizziness, tingling and headaches.    Vitals:   Vitals:   06/28/18 1150  BP: 120/70  Pulse: 79  Temp: 98.9 F (37.2 C)  TempSrc: Oral  SpO2: 98%  Weight: 142 lb 4 oz (64.5 kg)  Height: 5\' 5"  (1.651 m)     Body mass index is 23.67 kg/m.  Physical Exam:   Physical Exam Constitutional:      Appearance: She is well-developed.  HENT:     Head: Normocephalic and atraumatic.  Eyes:     Conjunctiva/sclera: Conjunctivae normal.  Neck:     Musculoskeletal: Normal range of motion and neck supple.  Pulmonary:     Effort: Pulmonary effort is normal.  Musculoskeletal: Normal range of motion.     Comments: No decreased ROM 2/2 pain with flexion/extension, lateral side bends, or rotation. Reproducible tenderness with deep palpation to bilateral paraspinal cervical muscles. No bony tenderness. No evidence of erythema, rash or ecchymosis.    Skin:    General: Skin is warm and dry.  Neurological:     Mental Status: She is alert and oriented to person, place, and time.  Psychiatric:        Behavior: Behavior normal.        Thought Content: Thought content normal.        Judgment: Judgment normal.     Assessment and Plan:   Toshia was seen today for neck and back pain.  Diagnoses and all orders for this visit:  Cervical pain (neck) Xray pending. No red flags on exam. I suspect that she has cervicalgia due to poor posture and poor ergonomics with transition to virtual college classes. Will trial mobic daily x 1 week, recommended pepcid or prilosec for GI protection, as well as prn flexeril. Handout on cervical towel stretches provided. She would like referral to Physical Therapy, I have placed referral with [place] MutaLauren Carroll, PT in our office. We also briefly discussed dry needling and she is interested in this if recommended by Lauren. -     Cancel: POCT urinalysis dipstick -      DG Cervical Spine Complete; Future -     Ambulatory referral to Physical Therapy  Other orders -     meloxicam (MOBIC) 15 MG tablet; Take 1 tablet (15 mg total) by mouth daily. -     cyclobenzaprine (FLEXERIL) 5 MG tablet; Take 1 tablet (5 mg total) by mouth 3 (three) times daily as needed for muscle spasms.  . Reviewed expectations re: course of current medical  issues. . Discussed self-management of symptoms. . Outlined signs and symptoms indicating need for more acute intervention. . Patient verbalized understanding and all questions were answered. . See orders for this visit as documented in the electronic medical record. . Patient received an After-Visit Summary.  CMA or LPN served as scribe during this visit. History, Physical, and Plan performed by medical provider. The above documentation has been reviewed and is accurate and complete.   Jarold MottoSamantha Kalana Yust, PA-C

## 2018-06-28 NOTE — Patient Instructions (Signed)
It was great to see you!  Start daily meloxicam, this is an anti-inflammatory. In general, if you use ibuprofen or meloxicam more than 3 days in a row, we should also have you take daily prilosec or pepcid (or store brand equivalent) to prevent irritation to the stomach lining (gastritis.) This can be discontinued once you stop the anti-inflammatory.  May use flexeril as needed for muscle spasms. May make you sleepy.  Work on stretches.  Someone will call you soon to start physical therapy with Lauren -- you can talk to her about possibly doing dry needling to help with the spasms.  I'll be in touch via Mychart as soon as the xray is back.    Take care,  Inda Coke PA-C

## 2018-07-05 ENCOUNTER — Other Ambulatory Visit: Payer: Self-pay | Admitting: Obstetrics and Gynecology

## 2018-07-05 NOTE — Telephone Encounter (Signed)
Medication refill request:Junel  Last OV:  08/24/17  Next AEX: Called patient and left message to call office for AEX appt  Last MMG (if hormonal medication request): NA Refill authorized: #[id number] with 0 RF

## 2018-07-08 ENCOUNTER — Ambulatory Visit: Payer: BC Managed Care – PPO | Admitting: Physical Therapy

## 2018-07-09 ENCOUNTER — Other Ambulatory Visit: Payer: Self-pay

## 2018-07-09 ENCOUNTER — Ambulatory Visit (INDEPENDENT_AMBULATORY_CARE_PROVIDER_SITE_OTHER): Payer: BC Managed Care – PPO | Admitting: Physical Therapy

## 2018-07-09 DIAGNOSIS — M542 Cervicalgia: Secondary | ICD-10-CM | POA: Diagnosis not present

## 2018-07-09 NOTE — Patient Instructions (Signed)
Access Code: [email or web address]  URL: [email or web address]  Date: 07/09/2018  Prepared by: Lyndee Hensen   Exercises Standing Backward Shoulder Rolls - 10 reps - 1 sets - 2x daily Seated Scapular Retraction - 10 reps - 1 sets - 2x daily Seated Cervical Retraction and Rotation - 10 reps - 1 sets - 2x daily Seated Cervical Retraction - 10 reps - 1 sets - 2x daily Seated Cervical Sidebending Stretch - 3 reps - 30 hold - 2x daily Seated Levator Scapulae Stretch - 3 reps - 30 hold - 2x daily

## 2018-07-10 ENCOUNTER — Encounter: Payer: Self-pay | Admitting: Physical Therapy

## 2018-07-10 NOTE — Therapy (Signed)
[facility] [address] [place], [place], [address] Phone: [phone]   Fax:  [phone]  Physical Therapy Evaluation  Patient Details  Name: Katrina FlorShalini Wood MRN: [id number] Date of Birth: April 15, 1995 Referring Provider (PT): Jarold MottoSamantha Worley   Encounter Date: 07/09/2018  PT End of Session - 07/10/18 1058    Visit Number  1    Number of Visits  12    Date for PT Re-Evaluation  08/20/18    Authorization Type  BC[employer]    PT Start Time  1505    PT Stop Time  1550    PT Time Calculation (min)  45 min    Activity Tolerance  Patient tolerated treatment well       History reviewed. No pertinent past medical history.  History reviewed. No pertinent surgical history.  There were no vitals filed for this visit.   Subjective Assessment - 07/09/18 1506    Subjective  Pt states ongoing pain for about 1 year. 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 She has not had any other treatment thus far.    Limitations  Sitting;Reading;Lifting;Writing;House hold activities    Patient Stated Goals  Decreased pain in neck/shoulders    Currently in Pain?  Yes    Pain Score  4     Pain Location  Neck    Pain Orientation  Right;Left    Pain Descriptors / Indicators  Tightness    Pain Type  Chronic pain    Pain Onset  More than a month ago    Pain Frequency  Intermittent    Aggravating Factors   sitting, school work, Animatorcomputer, writing, reading    Pain Relieving Factors  rest, heat, movement         [facility] PT Assessment - 07/10/18 0902      Assessment   Medical Diagnosis  Neck Pain    Referring Provider (PT)  Jarold MottoSamantha Worley    Hand Dominance  Right    Prior Therapy  no      Balance Screen   Has the patient fallen in the past 6 months  No      Prior Function   Level of Independence  Independent      Cognition   Overall Cognitive Status  Within Functional Limits for tasks  assessed      Posture/Postural Control   Posture Comments  flexible, rounded shoulders and forward head      ROM / Strength   AROM / PROM / Strength  AROM;Strength      AROM   Overall AROM Comments  Neck: WNL, Shoulders: WNL      Strength   Overall Strength Comments  UE: 4/5 gross,       Palpation   Palpation comment  Tightness and soreness in Bil UT, cervical paraspinals, and SCM, very tender sub occipitals.       Special Tests   Other special tests  Neg ULTT, Neg radicular symptoms.                 Objective measurements completed on examination: See above findings.      [facility] Adult PT Treatment/Exercise - 07/10/18 0902      Self-Care   Self-Care  Posture    Posture  Education on sleeping, sitting and computer posture       Exercises   Exercises  Neck      Neck Exercises: Seated   Neck  Retraction  20 reps    Cervical Rotation  10 reps    Shoulder Rolls  10 reps    Other Seated Exercise  Scap squeeze x10;       Manual Therapy   Manual Therapy  Passive ROM;Manual Traction    Passive ROM  Stretching for Bil UTs ; sub occipital release    Manual Traction  10 sec x8;       Neck Exercises: Stretches   Upper Trapezius Stretch  30 seconds;2 reps    Levator Stretch  2 reps;30 seconds             PT Education - 07/10/18 1058    Education Details  PT POC, HEP, exam findings.    Person(s) Educated  Patient    Methods  Explanation;Demonstration;Handout;Verbal cues    Comprehension  Verbalized understanding;Returned demonstration;Verbal cues required;Tactile cues required;Need further instruction       PT Short Term Goals - 07/10/18 1101      PT SHORT TERM GOAL #1   Title  Pt to be independent with initial HEP    Time  6    Period  Weeks    Status  New    Target Date  07/23/18      PT SHORT TERM GOAL #2   Title  Pt to report decreased pain to 3/10 with school/computer work.    Time  2    Period  Weeks    Status  New    Target Date  07/23/18         PT Long Term Goals - 07/10/18 1102      PT LONG TERM GOAL #1   Title  Pt to be independent with long term HEP    Time  6    Period  Weeks    Status  New    Target Date  08/20/18      PT LONG TERM GOAL #2   Title  Pt to demo ability to self correct posture in clinic    Time  6    Period  Weeks    Status  New    Target Date  08/20/18      PT LONG TERM GOAL #3   Title  Pt to repot decreased pain to 0-2/10 with school and computer work.    Time  6    Period  Weeks    Status  New    Target Date  08/20/18      PT LONG TERM GOAL #4   Title  Pt to demo increased strength of shoulders and postural muscles, to at least 4+/5 to improve ability and pain with reaching , lifting, carrying.    Time  6    Period  Weeks    Status  New    Target Date  08/20/18             Plan - 07/10/18 1336    Clinical Impression Statement  Pt presents wtih primary complaint of increased pain in cervical region. She has poor seated posture, which is likely cause. She has increased tightness and tenderness of most cervical musculature, and weakness of postural muscles. Pt to benefit from continued education on computer set up and ergonomics. Pt with decreased ability for full functional activities due to pain. Pt to benefit from skilled PT to improve.    Examination-Activity Limitations  Lift;Sleep;Sit    Examination-Participation Restrictions  Cleaning;School;Driving    Stability/Clinical Decision Making  Stable/Uncomplicated    Clinical  Decision Making  Low    Rehab Potential  Good    PT Frequency  2x / week    PT Duration  6 weeks    PT Treatment/Interventions  ADLs/Self Care Home Management;Cryotherapy;Electrical Stimulation;Ultrasound;Traction;Moist Heat;Iontophoresis 4mg /ml Dexamethasone;Functional mobility training;Therapeutic activities;Therapeutic exercise;Patient/family education;Neuromuscular re-education;Manual techniques;Compression bandaging;Passive range of motion;Dry  needling;Spinal Manipulations;Taping;Joint Manipulations    Consulted and Agree with Plan of Care  Patient       Patient will benefit from skilled therapeutic intervention in order to improve the following deficits and impairments:  Increased muscle spasms, Pain, Decreased strength, Improper body mechanics, Postural dysfunction  Visit Diagnosis: 1. Cervicalgia        Problem List Patient Active Problem List   Diagnosis Date Noted  . Chronic vulvovaginitis 03/30/2017   Lyndee Hensen, PT, DPT 1:40 PM  07/10/18    [facility] [place], [place], [address] Phone: [phone]   Fax:  [phone]  Name: Katrina Wood MRN: [id number] Date of Birth: 11/18/1995

## 2018-07-15 ENCOUNTER — Other Ambulatory Visit: Payer: Self-pay

## 2018-07-15 ENCOUNTER — Encounter: Payer: Self-pay | Admitting: Physical Therapy

## 2018-07-15 ENCOUNTER — Ambulatory Visit (INDEPENDENT_AMBULATORY_CARE_PROVIDER_SITE_OTHER): Payer: BC Managed Care – PPO | Admitting: Physical Therapy

## 2018-07-15 DIAGNOSIS — M542 Cervicalgia: Secondary | ICD-10-CM

## 2018-07-15 NOTE — Therapy (Signed)
[facility] [address] [place], [place], [address] Phone: [phone]   Fax:  [phone]  Physical Therapy Treatment  Patient Details  Name: Katrina Wood MRN: [id number] Date of Birth: 1995/07/27 Referring Provider (PT): Inda Coke   Encounter Date: 07/15/2018  PT End of Session - 07/15/18 1008    Visit Number  2    Number of Visits  12    Date for PT Re-Evaluation  08/20/18    Authorization Type  BCBS    PT Start Time  0905    PT Stop Time  0945    PT Time Calculation (min)  40 min    Activity Tolerance  Patient tolerated treatment well       History reviewed. No pertinent past medical history.  History reviewed. No pertinent surgical history.  There were no vitals filed for this visit.  Subjective Assessment - 07/15/18 0958    Subjective  Pt states she is improving with her posture wiht school work at home. She obtained desk stand for option to stand up. She states improved neck pain since eval.    Currently in Pain?  Yes    Pain Score  3     Pain Location  Neck    Pain Orientation  Right;Left    Pain Descriptors / Indicators  Tightness    Pain Type  Chronic pain    Pain Onset  More than a month ago    Pain Frequency  Intermittent                       [facility] Adult PT Treatment/Exercise - 07/15/18 0909      Posture/Postural Control   Posture Comments  flexible, rounded shoulders and forward head      Self-Care   Self-Care  Posture    Posture  Education on sleeping, sitting and computer posture       Exercises   Exercises  Neck      Neck Exercises: Theraband   Rows  Green;20 reps    Other Theraband Exercises  Lat pull, BLTB x20;       Neck Exercises: Standing   Neck Retraction  20 reps    Other Standing Exercises  Standing UE abd and scaption 1lb x15 each, with education for posure.       Neck Exercises: Seated   Neck Retraction  10 reps    Cervical Rotation  --    Shoulder Rolls   --    Other Seated Exercise  --      Manual Therapy   Manual Therapy  Passive ROM;Manual Traction;Soft tissue mobilization    Soft tissue mobilization  DTM for UTs and cervical paraspinals, sub occipitals,     Passive ROM  Stretching for Bil UTs ; sub occipital release    Manual Traction  10 sec x8;       Neck Exercises: Stretches   Upper Trapezius Stretch  30 seconds;2 reps    Levator Stretch  2 reps;30 seconds    Corner Stretch  3 reps;30 seconds    Corner Stretch Limitations  Doorway, 45 and 90 deg    Lower Cervical/Upper Thoracic Stretch  3 reps;30 seconds    Lower Cervical/Upper Thoracic Stretch Limitations  seated               PT Short Term Goals - 07/10/18 1101      PT SHORT TERM GOAL #1   Title  Pt to be  independent with initial HEP    Time  6    Period  Weeks    Status  New    Target Date  07/23/18      PT SHORT TERM GOAL #2   Title  Pt to report decreased pain to 3/10 with school/computer work.    Time  2    Period  Weeks    Status  New    Target Date  07/23/18        PT Long Term Goals - 07/10/18 1102      PT LONG TERM GOAL #1   Title  Pt to be independent with long term HEP    Time  6    Period  Weeks    Status  New    Target Date  08/20/18      PT LONG TERM GOAL #2   Title  Pt to demo ability to self correct posture in clinic    Time  6    Period  Weeks    Status  New    Target Date  08/20/18      PT LONG TERM GOAL #3   Title  Pt to repot decreased pain to 0-2/10 with school and computer work.    Time  6    Period  Weeks    Status  New    Target Date  08/20/18      PT LONG TERM GOAL #4   Title  Pt to demo increased strength of shoulders and postural muscles, to at least 4+/5 to improve ability and pain with reaching , lifting, carrying.    Time  6    Period  Weeks    Status  New    Target Date  08/20/18            Plan - 07/15/18 1010    Clinical Impression Statement  Pt with tightness and tenderness in sub occipital  region today. Discussed dry needling for muscle hypertonicity, pt declines today, but states she may want to do in future. Postural education continued, and postural strengthening progressed today.    Examination-Activity Limitations  Lift;Sleep;Sit    Examination-Participation Restrictions  Cleaning;School;Driving    Stability/Clinical Decision Making  Stable/Uncomplicated    Rehab Potential  Good    PT Frequency  2x / week    PT Duration  6 weeks    PT Treatment/Interventions  ADLs/Self Care Home Management;Cryotherapy;Electrical Stimulation;Ultrasound;Traction;Moist Heat;Iontophoresis 4mg /ml Dexamethasone;Functional mobility training;Therapeutic activities;Therapeutic exercise;Patient/family education;Neuromuscular re-education;Manual techniques;Compression bandaging;Passive range of motion;Dry needling;Spinal Manipulations;Taping;Joint Manipulations    Consulted and Agree with Plan of Care  Patient       Patient will benefit from skilled therapeutic intervention in order to improve the following deficits and impairments:  Increased muscle spasms, Pain, Decreased strength, Improper body mechanics, Postural dysfunction  Visit Diagnosis: 1. Cervicalgia        Problem List Patient Active Problem List   Diagnosis Date Noted  . Chronic vulvovaginitis 03/30/2017    Sedalia MutaLauren Pearla Mckinny, PT, DPT 12:20 PM  07/15/18    [facility] [address] [place], [place], [address] Phone: [phone]   Fax:  [phone]  Name: Katrina Wood MRN: [id number] Date of Birth: 10/28/1995

## 2018-07-17 ENCOUNTER — Other Ambulatory Visit: Payer: Self-pay

## 2018-07-17 ENCOUNTER — Encounter: Payer: Self-pay | Admitting: Physical Therapy

## 2018-07-17 ENCOUNTER — Ambulatory Visit (INDEPENDENT_AMBULATORY_CARE_PROVIDER_SITE_OTHER): Payer: BC Managed Care – PPO | Admitting: Physical Therapy

## 2018-07-17 ENCOUNTER — Other Ambulatory Visit: Payer: Self-pay | Admitting: Obstetrics and Gynecology

## 2018-07-17 DIAGNOSIS — M542 Cervicalgia: Secondary | ICD-10-CM

## 2018-07-17 NOTE — Therapy (Signed)
[facility] [address] , [place], [address] Phone: [phone]   Fax:  [phone]  Physical Therapy Treatment  Patient Details  Name: Katrina Wood MRN: [id number] Date of Birth: [birth year] Referring Provider (PT): Jarold MottoSamantha Worley   Encounter Date: 07/17/2018  PT End of Session - 07/17/18 1114    Visit Number  3    Number of Visits  12    Date for PT Re-Evaluation  08/20/18    Authorization Type  BCBS    PT Start Time  0905    PT Stop Time  1000    PT Time Calculation (min)  55 min    Activity Tolerance  Patient tolerated treatment well    Behavior During Therapy  [facility] for tasks assessed/performed       History reviewed. No pertinent past medical history.  History reviewed. No pertinent surgical history.  There were no vitals filed for this visit.  Subjective Assessment - 07/17/18 1113    Subjective  Pt is sore this am.    Currently in Pain?  Yes    Pain Score  4     Pain Location  Neck    Pain Orientation  Right;Left    Pain Descriptors / Indicators  Aching;Tightness    Pain Type  Chronic pain    Pain Onset  More than a month ago    Pain Frequency  Intermittent                       [facility] Adult PT Treatment/Exercise - 07/17/18 0909      Posture/Postural Control   Posture Comments  flexible, rounded shoulders and forward head      Self-Care   Self-Care  Posture    Posture  Education on sleeping, sitting and computer posture       Exercises   Exercises  Neck      Neck Exercises: Theraband   Rows  Green;20 reps    Horizontal ABduction  20 reps    Horizontal ABduction Limitations  YTB/standing    Other Theraband Exercises  --      Neck Exercises: Standing   Neck Retraction  --    Wall Push Ups  20 reps    Other Standing Exercises  --    Other Standing Exercises  high plank 1 minx 2;  low plank 45 sec x2; with education for posture and mechanics.      Neck Exercises: Seated   Neck  Retraction  --      Neck Exercises: Supine   Neck Retraction  20 reps      Neck Exercises: Prone   Axial Exension  Other reps (comment)    Axial Extension Limitations  locust pose 20 sec x2;       Modalities   Modalities  Moist Heat      Moist Heat Therapy   Number Minutes Moist Heat  10 Minutes    Moist Heat Location  Cervical      Manual Therapy   Manual Therapy  Passive ROM;Manual Traction;Soft tissue mobilization    Manual therapy comments  skilled palpation and monitioring of soft tissue with dry needling.     Soft tissue mobilization  DTM for UTs and cervical paraspinals, sub occipitals,     Passive ROM  Stretching for Bil UTs ; sub occipital release    Manual Traction  10 sec x6;       Neck Exercises: Stretches   Upper  Trapezius Stretch  --    Levator Stretch  --    Corner Stretch  3 reps;30 seconds    Corner Stretch Limitations  Doorway, 45 and 90 deg    Lower Cervical/Upper Thoracic Stretch  --    Lower Cervical/Upper Thoracic Stretch Limitations  --       Trigger Point Dry Needling - 07/17/18 0001    Consent Given?  Yes    Education Handout Provided  Yes    Muscles Treated Head and Neck  Upper trapezius;Splenius capitus    Upper Trapezius Response  Twitch reponse elicited;Palpable increased muscle length    Splenius capitus Response  Palpable increased muscle length           PT Education - 07/17/18 1114    Education Details  Dry needling benefits,risks, precautions, and after care.    Person(s) Educated  Patient    Methods  Explanation    Comprehension  Verbalized understanding       PT Short Term Goals - 07/10/18 1101      PT SHORT TERM GOAL #1   Title  Pt to be independent with initial HEP    Time  6    Period  Weeks    Status  New    Target Date  07/23/18      PT SHORT TERM GOAL #2   Title  Pt to report decreased pain to 3/10 with school/computer work.    Time  2    Period  Weeks    Status  New    Target Date  07/23/18        PT  Long Term Goals - 07/10/18 1102      PT LONG TERM GOAL #1   Title  Pt to be independent with long term HEP    Time  6    Period  Weeks    Status  New    Target Date  08/20/18      PT LONG TERM GOAL #2   Title  Pt to demo ability to self correct posture in clinic    Time  6    Period  Weeks    Status  New    Target Date  08/20/18      PT LONG TERM GOAL #3   Title  Pt to repot decreased pain to 0-2/10 with school and computer work.    Time  6    Period  Weeks    Status  New    Target Date  08/20/18      PT LONG TERM GOAL #4   Title  Pt to demo increased strength of shoulders and postural muscles, to at least 4+/5 to improve ability and pain with reaching , lifting, carrying.    Time  6    Period  Weeks    Status  New    Target Date  08/20/18            Plan - 07/17/18 1117    Clinical Impression Statement  Dry needling performed today for cervical muscles, may require futher sessions to fully improve. Pt with improving tenderness in sub occipital region. THer ex progressed for postural muscles today, pt challenged with this due to weakness. Plan to progress as tolerated.    Examination-Activity Limitations  Lift;Sleep;Sit    Examination-Participation Restrictions  Cleaning;School;Driving    Stability/Clinical Decision Making  Stable/Uncomplicated    Rehab Potential  Good    PT Frequency  2x / week  PT Duration  6 weeks    PT Treatment/Interventions  ADLs/Self Care Home Management;Cryotherapy;Electrical Stimulation;Ultrasound;Traction;Moist Heat;Iontophoresis 4mg /ml Dexamethasone;Functional mobility training;Therapeutic activities;Therapeutic exercise;Patient/family education;Neuromuscular re-education;Manual techniques;Compression bandaging;Passive range of motion;Dry needling;Spinal Manipulations;Taping;Joint Manipulations    Consulted and Agree with Plan of Care  Patient       Patient will benefit from skilled therapeutic intervention in order to improve the  following deficits and impairments:  Increased muscle spasms, Pain, Decreased strength, Improper body mechanics, Postural dysfunction  Visit Diagnosis: 1. Cervicalgia        Problem List Patient Active Problem List   Diagnosis Date Noted  . Chronic vulvovaginitis 03/30/2017    Sedalia MutaLauren Gurvir Schrom, PT, DPT 11:19 AM  07/17/18    [facility] [address] [place], [place], [address] Phone: [phone]   Fax:  [phone]  Name: Katrina Wood MRN: [id number] Date of Birth: Sep 15, 1995

## 2018-07-17 NOTE — Telephone Encounter (Signed)
Medication refill request: Junel  Last OV: 08/24/17  Next AEX: Left message advising patient to schedule AEX  Last MMG (if hormonal medication request): NA Refill authorized:#28 with 0 RF

## 2018-07-22 ENCOUNTER — Other Ambulatory Visit: Payer: Self-pay | Admitting: Physician Assistant

## 2018-07-22 MED ORDER — NORETHINDRONE ACET-ETHINYL EST 1-20 MG-MCG PO TABS: 1.0000 | ORAL_TABLET | Freq: Every day | ORAL | 1 refills | Status: DC

## 2018-07-22 NOTE — Telephone Encounter (Signed)
Medication Refill - Medication:  norethindrone-ethinyl estradiol (JUNEL 1/20) 1-20 MG-MCG tablet [[id number]]    Has the patient contacted their pharmacy? No. (Agent: If no, request that the patient contact the pharmacy for the refill.) The patient is out of this medication   Preferred Pharmacy (with phone number or street name):  [id number] - [place], [place] - [address] [phone] (Phone) [phone] (Fax)     Agent: Please be advised that RX refills may take up to 3 business days. We ask that you follow-up with your pharmacy.

## 2018-07-22 NOTE — Telephone Encounter (Signed)
See note

## 2018-07-22 NOTE — Telephone Encounter (Signed)
Refill sent to pharmacy as requested

## 2018-07-24 ENCOUNTER — Ambulatory Visit (INDEPENDENT_AMBULATORY_CARE_PROVIDER_SITE_OTHER): Payer: BC Managed Care – PPO | Admitting: Physical Therapy

## 2018-07-24 ENCOUNTER — Encounter: Payer: Self-pay | Admitting: Physical Therapy

## 2018-07-24 ENCOUNTER — Other Ambulatory Visit: Payer: Self-pay

## 2018-07-24 DIAGNOSIS — M542 Cervicalgia: Secondary | ICD-10-CM | POA: Diagnosis not present

## 2018-07-24 NOTE — Therapy (Signed)
[facility] [address] [place], [place], [address] Phone: [phone]   Fax:  [phone]  Physical Therapy Treatment  Patient Details  Name: Katrina Wood MRN: [id number] Date of Birth: Mar 10, 1995 Referring Provider (PT): Inda Coke   Encounter Date: 07/24/2018  PT End of Session - 07/24/18 1106    Visit Number  4    Number of Visits  12    Date for PT Re-Evaluation  08/20/18    Authorization Type  BCBS    PT Start Time  1102    PT Stop Time  1200    PT Time Calculation (min)  58 min    Activity Tolerance  Patient tolerated treatment well    Behavior During Therapy  [facility] for tasks assessed/performed       History reviewed. No pertinent past medical history.  History reviewed. No pertinent surgical history.  There were no vitals filed for this visit.  Subjective Assessment - 07/24/18 1104    Subjective  Pt states decreased pain, has been able to have minimal pain during the day, but does have increased soreness in AM when she wakes up.    Patient Stated Goals  Decreased pain in neck/shoulders    Currently in Pain?  Yes    Pain Score  5     Pain Location  Neck    Pain Orientation  Right;Left    Pain Descriptors / Indicators  Tightness;Aching    Pain Type  Chronic pain    Pain Radiating Towards  Pain up to 5/10 in AM.    Pain Onset  More than a month ago    Pain Frequency  Intermittent                       [facility] Adult PT Treatment/Exercise - 07/24/18 1106      Posture/Postural Control   Posture Comments  flexible, rounded shoulders and forward head      Self-Care   Self-Care  Posture    Posture  Education on sleeping, sitting and computer posture       Exercises   Exercises  Neck      Neck Exercises: Theraband   Rows  Green;20 reps    Horizontal ABduction  20 reps    Horizontal ABduction Limitations  RTB/standing      Neck Exercises: Standing   Wall Push Ups  20 reps    Other Standing  Exercises  Standing UE abd and scaption 2lb x15 each, with education for posure.; Bil ER RTB x20;     Other Standing Exercises  --      Neck Exercises: Supine   Neck Retraction  --      Neck Exercises: Prone   Axial Exension  --    Axial Extension Limitations  --    Other Prone Exercise  high plank 45 sec  minx 2; with education for posture and mechanics.    Other Prone Exercise  push ups on knees x20;       Modalities   Modalities  Moist Heat      Moist Heat Therapy   Number Minutes Moist Heat  10 Minutes    Moist Heat Location  Cervical      Manual Therapy   Manual Therapy  Passive ROM;Manual Traction;Soft tissue mobilization    Manual therapy comments  skilled palpation and monitioring of soft tissue with dry needling.     Soft tissue mobilization  DTM for UTs and  cervical paraspinals,     Passive ROM  Stretching for Bil UTs ;     Manual Traction  --      Neck Exercises: Stretches   Corner Stretch  3 reps;30 seconds    Corner Stretch Limitations  Doorway, 45 and 90 deg       Trigger Point Dry Needling - 07/24/18 0001    Consent Given?  Yes    Education Handout Provided  Previously provided    Muscles Treated Head and Neck  Upper trapezius;Splenius capitus    Upper Trapezius Response  Twitch reponse elicited;Palpable increased muscle length   Bil   Splenius capitus Response  Palpable increased muscle length   L            PT Short Term Goals - 07/10/18 1101      PT SHORT TERM GOAL #1   Title  Pt to be independent with initial HEP    Time  6    Period  Weeks    Status  New    Target Date  07/23/18      PT SHORT TERM GOAL #2   Title  Pt to report decreased pain to 3/10 with school/computer work.    Time  2    Period  Weeks    Status  New    Target Date  07/23/18        PT Long Term Goals - 07/10/18 1102      PT LONG TERM GOAL #1   Title  Pt to be independent with long term HEP    Time  6    Period  Weeks    Status  New    Target Date   08/20/18      PT LONG TERM GOAL #2   Title  Pt to demo ability to self correct posture in clinic    Time  6    Period  Weeks    Status  New    Target Date  08/20/18      PT LONG TERM GOAL #3   Title  Pt to repot decreased pain to 0-2/10 with school and computer work.    Time  6    Period  Weeks    Status  New    Target Date  08/20/18      PT LONG TERM GOAL #4   Title  Pt to demo increased strength of shoulders and postural muscles, to at least 4+/5 to improve ability and pain with reaching , lifting, carrying.    Time  6    Period  Weeks    Status  New    Target Date  08/20/18            Plan - 07/24/18 1254    Clinical Impression Statement  Pt with improving soreness overall. She has improving ability for ther ex, still challenged due to weakness. She has decreased soreness at sub occiptial region. TIghtness and trigger points noted today in upper traps, DN done to address this. Pt with good tolerance during DN, but does state soreness after sesson in UTs. Pt educated on continuing to stretch and use heat. Pt will be out of town for 2 weeks, will return when she gets back.    Examination-Activity Limitations  Lift;Sleep;Sit    Examination-Participation Restrictions  Cleaning;School;Driving    Stability/Clinical Decision Making  Stable/Uncomplicated    Rehab Potential  Good    PT Frequency  2x / week    PT Duration  6 weeks    PT Treatment/Interventions  ADLs/Self Care Home Management;Cryotherapy;Electrical Stimulation;Ultrasound;Traction;Moist Heat;Iontophoresis 4mg /ml Dexamethasone;Functional mobility training;Therapeutic activities;Therapeutic exercise;Patient/family education;Neuromuscular re-education;Manual techniques;Compression bandaging;Passive range of motion;Dry needling;Spinal Manipulations;Taping;Joint Manipulations    Consulted and Agree with Plan of Care  Patient       Patient will benefit from skilled therapeutic intervention in order to improve the  following deficits and impairments:  Increased muscle spasms, Pain, Decreased strength, Improper body mechanics, Postural dysfunction  Visit Diagnosis: 1. Cervicalgia        Problem List Patient Active Problem List   Diagnosis Date Noted  . Chronic vulvovaginitis 03/30/2017   Sedalia MutaLauren Apoorva Bugay, PT, DPT 12:56 PM  07/24/18    [facility] [address] [place], [place], [address] Phone: [phone]   Fax:  [phone]  Name: Harlon FlorShalini Blanchet MRN: [id number] Date of Birth: 1995/04/05

## 2018-08-11 ENCOUNTER — Other Ambulatory Visit: Payer: Self-pay | Admitting: Physician Assistant

## 2018-08-20 ENCOUNTER — Encounter: Payer: Self-pay | Admitting: Physical Therapy

## 2018-08-20 ENCOUNTER — Other Ambulatory Visit: Payer: Self-pay

## 2018-08-20 ENCOUNTER — Ambulatory Visit (INDEPENDENT_AMBULATORY_CARE_PROVIDER_SITE_OTHER): Payer: BC Managed Care – PPO | Admitting: Physical Therapy

## 2018-08-20 DIAGNOSIS — M542 Cervicalgia: Secondary | ICD-10-CM

## 2018-08-20 NOTE — Therapy (Signed)
[facility] [address] [place], [place], [address] Phone: [phone]   Fax:  [phone]  Physical Therapy Treatment/Discharge  Patient Details  Name: Katrina Wood MRN: [id number] Date of Birth: 1995-08-25 Referring Provider (PT): Inda Coke   Encounter Date: 08/20/2018  PT End of Session - 08/20/18 1422    Visit Number  5    Number of Visits  12    Date for PT Re-Evaluation  08/20/18    Authorization Type  BCBS    PT Start Time  1105    PT Stop Time  1150    PT Time Calculation (min)  45 min    Activity Tolerance  Patient tolerated treatment well    Behavior During Therapy  [facility] for tasks assessed/performed       History reviewed. No pertinent past medical history.  History reviewed. No pertinent surgical history.  There were no vitals filed for this visit.  Subjective Assessment - 08/20/18 1420    Subjective  Pt last seen at beginning of July. She was out of state for a few weeks. States that she was doing well, but flying on airplane and sleeping in different bed have aggravated pain. She will be returning to school next week.    Patient Stated Goals  Decreased pain in neck/shoulders    Currently in Pain?  Yes    Pain Score  4     Pain Location  Neck    Pain Orientation  Left;Right    Pain Descriptors / Indicators  Aching;Tightness    Pain Type  Chronic pain    Pain Onset  More than a month ago    Pain Frequency  Intermittent                       [facility] Adult PT Treatment/Exercise - 08/20/18 1112      Posture/Postural Control   Posture Comments  --      Self-Care   Self-Care  Posture    Posture  Education on sleeping, sitting and computer posture       Exercises   Exercises  Neck      Neck Exercises: Theraband   Rows  Green;20 reps    Shoulder External Rotation  20 reps;Red    Shoulder External Rotation Limitations  bil    Horizontal ABduction  20 reps    Horizontal ABduction  Limitations  RTB/standing    Other Theraband Exercises  Low Row RTB x20;       Neck Exercises: Standing   Wall Push Ups  20 reps    Other Standing Exercises  --      Neck Exercises: Prone   Other Prone Exercise  Quadruped UE/LE lifts x20;     Other Prone Exercise  push ups on knees x20;       Modalities   Modalities  --      Moist Heat Therapy   Moist Heat Location  --      Manual Therapy   Manual Therapy  Passive ROM;Manual Traction;Soft tissue mobilization    Manual therapy comments  skilled palpation and monitioring of soft tissue with dry needling.     Soft tissue mobilization  DTM for UTs and cervical paraspinals,     Passive ROM  --      Neck Exercises: Stretches   Upper Trapezius Stretch  30 seconds;2 reps    Levator Stretch  2 reps;30 seconds    Corner Stretch  3  reps;30 seconds    Corner Stretch Limitations  Doorway, 45 and 90 deg       Trigger Point Dry Needling - 08/20/18 0001    Consent Given?  Yes    Education Handout Provided  Previously provided    Muscles Treated Head and Neck  Upper trapezius;Splenius capitus    Upper Trapezius Response  Twitch reponse elicited;Palpable increased muscle length   bil   Splenius capitus Response  Twitch reponse elicited;Palpable increased muscle length   bil            PT Short Term Goals - 08/20/18 1422      PT SHORT TERM GOAL #1   Title  Pt to be independent with initial HEP    Time  6    Period  Weeks    Status  Achieved    Target Date  07/23/18      PT SHORT TERM GOAL #2   Title  Pt to report decreased pain to 3/10 with school/computer work.    Time  2    Period  Weeks    Status  Partially Met    Target Date  07/23/18        PT Long Term Goals - 08/20/18 1422      PT LONG TERM GOAL #1   Title  Pt to be independent with long term HEP    Time  6    Period  Weeks    Status  Achieved      PT LONG TERM GOAL #2   Title  Pt to demo ability to self correct posture in clinic    Time  6    Period   Weeks    Status  Achieved      PT LONG TERM GOAL #3   Title  Pt to repot decreased pain to 0-2/10 with school and computer work.    Time  6    Period  Weeks    Status  Partially Met      PT LONG TERM GOAL #4   Title  Pt to demo increased strength of shoulders and postural muscles, to at least 4+/5 to improve ability and pain with reaching , lifting, carrying.    Time  6    Period  Weeks    Status  Achieved            Plan - 08/20/18 1423    Clinical Impression Statement  Pt with improved pain overall. She does have tightness and soreness bilaterally today. DN done with good response. Pain has been variable based on activities. Pt with much improved postural awareness and ability to correct posture in clinic and at home with school work. Pt wtih improved strength of UEs and postural muscles. Pt going back to school next week, will unlikely return. Will d/c at this time. Most goals met.    Examination-Activity Limitations  Lift;Sleep;Sit    Examination-Participation Restrictions  Cleaning;School;Driving    Stability/Clinical Decision Making  Stable/Uncomplicated    Rehab Potential  Good    PT Frequency  2x / week    PT Duration  6 weeks    PT Treatment/Interventions  ADLs/Self Care Home Management;Cryotherapy;Electrical Stimulation;Ultrasound;Traction;Moist Heat;Iontophoresis 29m/ml Dexamethasone;Functional mobility training;Therapeutic activities;Therapeutic exercise;Patient/family education;Neuromuscular re-education;Manual techniques;Compression bandaging;Passive range of motion;Dry needling;Spinal Manipulations;Taping;Joint Manipulations    Consulted and Agree with Plan of Care  Patient       Patient will benefit from skilled therapeutic intervention in order to improve the following deficits and impairments:  Increased muscle spasms, Pain, Decreased strength, Improper body mechanics, Postural dysfunction  Visit Diagnosis: 1. Cervicalgia        Problem List Patient  Active Problem List   Diagnosis Date Noted  . Chronic vulvovaginitis 03/30/2017    Lyndee Hensen, PT, DPT 2:25 PM  08/20/18    [facility] [place], [place], [address] Phone: [phone]   Fax:  [phone]  Name: TRUE Shackleford MRN: [id number] Date of Birth: Mar 01, 1995   PHYSICAL THERAPY DISCHARGE SUMMARY  Visits from Start of Care: 5      Plan: Patient agrees to discharge.  Patient goals were met. Patient is being discharged due to meeting the stated rehab goals.  ?????    Pt going back to school, may continue PT closer to school location . Most goals met.    Lyndee Hensen, PT, DPT 2:26 PM  08/20/18

## 2018-09-10 ENCOUNTER — Other Ambulatory Visit: Payer: Self-pay | Admitting: *Deleted

## 2018-09-10 MED ORDER — NORETHIN ACE-ETH ESTRAD-FE 1-20 MG-MCG PO TABS: 1.0000 | ORAL_TABLET | Freq: Every day | ORAL | 1 refills | Status: DC

## 2018-10-06 ENCOUNTER — Other Ambulatory Visit: Payer: Self-pay | Admitting: Physician Assistant

## 2018-10-30 ENCOUNTER — Encounter: Payer: BC Managed Care – PPO | Admitting: Physician Assistant

## 2018-11-01 ENCOUNTER — Other Ambulatory Visit: Payer: Self-pay

## 2018-11-01 ENCOUNTER — Encounter: Payer: Self-pay | Admitting: Physician Assistant

## 2018-11-01 ENCOUNTER — Ambulatory Visit (INDEPENDENT_AMBULATORY_CARE_PROVIDER_SITE_OTHER): Payer: BC Managed Care – PPO | Admitting: Physician Assistant

## 2018-11-01 VITALS — BP 120/72 | HR 77 | Temp 98.6°F | Ht 65.0 in | Wt 143.0 lb

## 2018-11-01 DIAGNOSIS — Z13220 Encounter for screening for lipoid disorders: Secondary | ICD-10-CM | POA: Diagnosis not present

## 2018-11-01 DIAGNOSIS — F419 Anxiety disorder, unspecified: Secondary | ICD-10-CM

## 2018-11-01 DIAGNOSIS — Z23 Encounter for immunization: Secondary | ICD-10-CM | POA: Diagnosis not present

## 2018-11-01 DIAGNOSIS — Z0001 Encounter for general adult medical examination with abnormal findings: Secondary | ICD-10-CM | POA: Diagnosis not present

## 2018-11-01 DIAGNOSIS — Z7189 Other specified counseling: Secondary | ICD-10-CM

## 2018-11-01 DIAGNOSIS — M542 Cervicalgia: Secondary | ICD-10-CM | POA: Diagnosis not present

## 2018-11-01 DIAGNOSIS — Z136 Encounter for screening for cardiovascular disorders: Secondary | ICD-10-CM

## 2018-11-01 DIAGNOSIS — N3010 Interstitial cystitis (chronic) without hematuria: Secondary | ICD-10-CM

## 2018-11-01 LAB — COMPREHENSIVE METABOLIC PANEL
ALT: 12 U/L (ref 0–35)
AST: 17 U/L (ref 0–37)
Albumin: 4.1 g/dL (ref 3.5–5.2)
Alkaline Phosphatase: 55 U/L (ref 39–117)
BUN: 9 mg/dL (ref 6–23)
CO2: 24 mEq/L (ref 19–32)
Calcium: 9.3 mg/dL (ref 8.4–10.5)
Chloride: 104 mEq/L (ref 96–112)
Creatinine, Ser: 0.64 mg/dL (ref 0.40–1.20)
GFR: 117.27 mL/min (ref >60.00)
Glucose, Bld: 101 mg/dL — ABNORMAL HIGH (ref 70–99)
Potassium: 4.3 mEq/L (ref 3.5–5.1)
Sodium: 137 mEq/L (ref 135–145)
Total Bilirubin: 0.3 mg/dL (ref 0.2–1.2)
Total Protein: 6.8 g/dL (ref 6.0–8.3)

## 2018-11-01 LAB — CBC WITH DIFFERENTIAL/PLATELET
Basophils Absolute: 0 10*3/uL (ref 0.0–0.1)
Basophils Relative: 0.2 % (ref 0.0–3.0)
Eosinophils Absolute: 0.1 10*3/uL (ref 0.0–0.7)
Eosinophils Relative: 1.1 % (ref 0.0–5.0)
HCT: 38.4 % (ref 36.0–46.0)
Hemoglobin: 12.7 g/dL (ref 12.0–15.0)
Lymphocytes Relative: 40.2 % (ref 12.0–46.0)
Lymphs Abs: 2.9 10*3/uL (ref 0.7–4.0)
MCHC: 33 g/dL (ref 30.0–36.0)
MCV: 80.2 fl (ref 78.0–100.0)
Monocytes Absolute: 0.6 10*3/uL (ref 0.1–1.0)
Monocytes Relative: 7.8 % (ref 3.0–12.0)
Neutro Abs: 3.7 10*3/uL (ref 1.4–7.7)
Neutrophils Relative %: 50.7 % (ref 43.0–77.0)
Platelets: 326 10*3/uL (ref 150.0–400.0)
RBC: 4.79 Mil/uL (ref 3.87–5.11)
RDW: 14.5 % (ref 11.5–14.6)
WBC: 7.3 10*3/uL (ref 4.5–10.5)

## 2018-11-01 LAB — LIPID PANEL
Cholesterol: 167 mg/dL (ref 0–200)
HDL: 44 mg/dL (ref >39.00)
LDL Cholesterol: 95 mg/dL (ref 0–99)
NonHDL: 123.05
Total CHOL/HDL Ratio: 4
Triglycerides: 140 mg/dL (ref 0.0–149.0)
VLDL: 28 mg/dL (ref 0.0–40.0)

## 2018-11-01 LAB — TSH: TSH: 2.49 u[IU]/mL (ref 0.35–5.50)

## 2018-11-01 NOTE — Progress Notes (Signed)
Subjective:    Katrina Wood is a 23 y.o. female and is here for a comprehensive physical exam.  HPI  There are no preventive care reminders to display for this patient.  Acute Concerns: Neck pain --patient reports that despite seeing Lauren for physical therapy, she is having ongoing neck pain.  She is wondering if she should see an orthopedist.  She denies any new symptoms.  And her symptoms did improve after she was setting less for her exam, but she is wondering if there is anything else that she can do to help with daily neck pain. Antibody testing for Covid -patient is interested in this today.  Denies any known exposures.  Chronic Issues: Anxiety --continues to see a therapist for this.  She feels like this is well controlled. Interstitial cystitis -- was diagnosed with urology.  Has intermittent issues.  None today.  Health Maintenance: Immunizations --flu shot today PAP --never Diet --overall variable diet Caffeine intake --no excessive Sleep habits --sleeps well, but does have neck pain after waking up Exercise --minimal Current Weight -- Weight: 143 lb (64.9 kg)  Weight History: Wt Readings from Last 10 Encounters:  11/01/18 143 lb (64.9 kg)  06/28/18 142 lb 4 oz (64.5 kg)  04/01/18 140 lb (63.5 kg)  12/12/17 139 lb (63 kg) (67 %, Z= 0.45)*  11/02/17 136 lb 8 oz (61.9 kg) (64 %, Z= 0.36)*  08/24/17 133 lb (60.3 kg) (59 %, Z= 0.23)*  07/27/17 136 lb (61.7 kg) (64 %, Z= 0.36)*  07/18/17 133 lb 4 oz (60.4 kg) (60 %, Z= 0.25)*  03/30/17 133 lb (60.3 kg) (61 %, Z= 0.27)*  01/03/17 133 lb (60.3 kg) (62 %, Z= 0.30)*   * Growth percentiles are based on CDC (Girls, 2-20 Years) data.   Mood --intermittent issues with anxiety, denies depressive thoughts No LMP recorded. Birth control --oral contraceptives  Depression screen PHQ 2/9 07/18/2017  Decreased Interest 0  Down, Depressed, Hopeless 0  PHQ - 2 Score 0     Other providers/specialists: Patient Care Team:  Jarold Motto, [place] as PCP - General (Physician Assistant)   PMHx, SurgHx, SocialHx, Medications, and Allergies were reviewed in the Visit Navigator and updated as appropriate.   History reviewed. No pertinent past medical history.  History reviewed. No pertinent surgical history.   Family History  Problem Relation Age of Onset  . Hyperlipidemia Mother   . Hypertension Mother   . Asthma Sister   . Hyperlipidemia Maternal Grandfather   . Hypertension Maternal Grandfather   . Heart attack Maternal Grandfather   . Stroke Paternal Grandfather     Social History   Tobacco Use  . Smoking status: Never Smoker  . Smokeless tobacco: Never Used  Substance Use Topics  . Alcohol use: No  . Drug use: No    Review of Systems:   Review of Systems  Constitutional: Negative for chills, fever, malaise/fatigue and weight loss.  HENT: Negative for hearing loss, sinus pain and sore throat.   Respiratory: Negative for cough and hemoptysis.   Cardiovascular: Negative for chest pain, palpitations, leg swelling and PND.  Gastrointestinal: Negative for abdominal pain, constipation, diarrhea, heartburn, nausea and vomiting.  Genitourinary: Negative for dysuria, frequency and urgency.  Musculoskeletal: Positive for neck pain. Negative for back pain and myalgias.  Skin: Negative for itching and rash.  Neurological: Negative for dizziness, tingling, seizures and headaches.  Endo/Heme/Allergies: Negative for polydipsia.  Psychiatric/Behavioral: Negative for depression. The patient is not nervous/anxious.  Objective:   BP 120/72 (BP Location: Left Arm, Patient Position: Sitting, Cuff Size: Normal)   Pulse 77   Temp 98.6 F (37 C)   Ht  (1.651 m)   Wt 143 lb (64.9 kg)   SpO2 98%   BMI 23.80 kg/m   General Appearance:    Alert, cooperative, no distress, appears stated age  Head:    Normocephalic, without obvious abnormality, atraumatic  Eyes:    PERRL, conjunctiva/corneas clear,  EOM's intact, fundi    benign, both eyes  Ears:    Normal TM's and external ear canals, both ears  Nose:   Nares normal, septum midline, mucosa normal, no drainage    or sinus tenderness  Throat:   Lips, mucosa, and tongue normal; teeth and gums normal  Neck:   Supple, symmetrical, trachea midline, no adenopathy;    thyroid:  no enlargement/tenderness/nodules; no carotid   bruit or JVD  Back:     Symmetric, no curvature, ROM normal, no CVA tenderness  Lungs:     Clear to auscultation bilaterally, respirations unlabored  Chest Wall:    No tenderness or deformity   Heart:    Regular rate and rhythm, S1 and S2 normal, no murmur, rub   or gallop  Breast Exam:   Deferred  Abdomen:     Soft, non-tender, bowel sounds active all four quadrants,    no masses, no organomegaly  Genitalia:   Deferred  Rectal:   Deferred  Extremities:   Extremities normal, atraumatic, no cyanosis or edema  Pulses:   2+ and symmetric all extremities  Skin:   Skin color, texture, turgor normal, no rashes or lesions  Lymph nodes:   Cervical, supraclavicular, and axillary nodes normal  Neurologic:   CNII-XII intact, normal strength, sensation and reflexes    throughout    Assessment/Plan:   Katrina Wood was seen today for annual exam.  Diagnoses and all orders for this visit:  Encounter for general adult medical examination with abnormal findings Today patient counseled on age appropriate routine health concerns for screening and prevention, each reviewed and up to date or declined. Immunizations reviewed and up to date or declined. Labs ordered and reviewed. Risk factors for depression reviewed and negative. Hearing function and visual acuity are intact. ADLs screened and addressed as needed. Functional ability and level of safety reviewed and appropriate. Education, counseling and referrals performed based on assessed risks today. Patient provided with a copy of personalized plan for preventive services. -     CBC with  Differential/Platelet -     Comprehensive metabolic panel -     TSH -     SAR CoV2 Serology (COVID 19)AB(IGG)IA  Interstitial cystitis Denies any symptoms today.  Did discuss potentially pursuing PT pelvic floor.  She will consider this.  Anxiety Well controlled.  Follow-up as needed.  Cervical pain (neck) Will reach out to Lauren, our physical therapist, and see if she has any other specific recommendations for neck steps.  Advice given about COVID-19 Will check antibody test per patient request.  Encounter for lipid screening for cardiovascular disease -     Lipid panel  Need for immunization against influenza -     Flu Vaccine QUAD 36+ mos IM  Well Adult Exam: Labs ordered: Yes. Patient counseling was done. See below for items discussed. Discussed the patient's BMI. The BMI is in the acceptable range Follow up as needed for acute illness.  Patient Counseling:       Nutrition: Stressed  importance of moderation in sodium/caffeine intake, saturated fat and cholesterol, caloric balance, sufficient intake of fresh fruits, vegetables, fiber, calcium, iron, and 1 mg of folate supplement per day (for females capable of pregnancy).   [x]      Stressed the importance of regular exercise.    [x]     Substance Abuse: Discussed cessation/primary prevention of tobacco, alcohol, or other drug use; driving or other dangerous activities under the influence; availability of treatment for abuse.    [x]      Injury prevention: Discussed safety belts, safety helmets, smoke detector, smoking near bedding or upholstery.    [x]      Sexuality: Discussed sexually transmitted diseases, partner selection, use of condoms, avoidance of unintended pregnancy  and contraceptive alternatives.    [x]     Dental health: Discussed importance of regular tooth brushing, flossing, and dental visits.   [x]      Health maintenance and immunizations reviewed. Please refer to Health maintenance section.    Inda Coke, PA-C [place]

## 2018-11-01 NOTE — Patient Instructions (Addendum)
It was great to see you!  Please go to the lab for blood work.   Our office will call you with your results unless you have chosen to receive results via MyChart.  If your blood work is normal we will follow-up each year for physicals and as scheduled for chronic medical problems.  If anything is abnormal we will treat accordingly and get you in for a follow-up.  Take care,  Aldona Bar  *I will be in touch with Lauren's recommendation on our next step for your neck pain *consider pelvic floor PT as we discussed  GOOD LUCK WITH INTERVIEWS :)   Health Maintenance, Female Adopting a healthy lifestyle and getting preventive care are important in promoting health and wellness. Ask your health care provider about:  The right schedule for you to have regular tests and exams.  Things you can do on your own to prevent diseases and keep yourself healthy. What should I know about diet, weight, and exercise? Eat a healthy diet   Eat a diet that includes plenty of vegetables, fruits, low-fat dairy products, and lean protein.  Do not eat a lot of foods that are high in solid fats, added sugars, or sodium. Maintain a healthy weight Body mass index (BMI) is used to identify weight problems. It estimates body fat based on height and weight. Your health care provider can help determine your BMI and help you achieve or maintain a healthy weight. Get regular exercise Get regular exercise. This is one of the most important things you can do for your health. Most adults should:  Exercise for at least 150 minutes each week. The exercise should increase your heart rate and make you sweat (moderate-intensity exercise).  Do strengthening exercises at least twice a week. This is in addition to the moderate-intensity exercise.  Spend less time sitting. Even light physical activity can be beneficial. Watch cholesterol and blood lipids Have your blood tested for lipids and cholesterol at 21 years of age,  then have this test every 5 years. Have your cholesterol levels checked more often if:  Your lipid or cholesterol levels are high.  You are older than 21 years of age.  You are at high risk for heart disease. What should I know about cancer screening? Depending on your health history and family history, you may need to have cancer screening at various ages. This may include screening for:  Breast cancer.  Cervical cancer.  Colorectal cancer.  Skin cancer.  Lung cancer. What should I know about heart disease, diabetes, and high blood pressure? Blood pressure and heart disease  High blood pressure causes heart disease and increases the risk of stroke. This is more likely to develop in people who have high blood pressure readings, are of African descent, or are overweight.  Have your blood pressure checked: ? Every 3-5 years if you are 47-56 years of age. ? Every year if you are 28 years old or older. Diabetes Have regular diabetes screenings. This checks your fasting blood sugar level. Have the screening done:  Once every three years after age 11 if you are at a normal weight and have a low risk for diabetes.  More often and at a younger age if you are overweight or have a high risk for diabetes. What should I know about preventing infection? Hepatitis B If you have a higher risk for hepatitis B, you should be screened for this virus. Talk with your health care provider to find out if you are  at risk for hepatitis B infection. Hepatitis C Testing is recommended for:  Everyone born from [birth year] through 1963.  Anyone with known risk factors for hepatitis C. Sexually transmitted infections (STIs)  Get screened for STIs, including gonorrhea and chlamydia, if: ? You are sexually active and are younger than 21 years of age. ? You are older than 21 years of age and your health care provider tells you that you are at risk for this type of infection. ? Your sexual activity has  changed since you were last screened, and you are at increased risk for chlamydia or gonorrhea. Ask your health care provider if you are at risk.  Ask your health care provider about whether you are at high risk for HIV. Your health care provider may recommend a prescription medicine to help prevent HIV infection. If you choose to take medicine to prevent HIV, you should first get tested for HIV. You should then be tested every 3 months for as long as you are taking the medicine. Pregnancy  If you are about to stop having your period (premenopausal) and you may become pregnant, seek counseling before you get pregnant.  Take 400 to 800 micrograms (mcg) of folic acid every day if you become pregnant.  Ask for birth control (contraception) if you want to prevent pregnancy. Osteoporosis and menopause Osteoporosis is a disease in which the bones lose minerals and strength with aging. This can result in bone fractures. If you are 54 years old or older, or if you are at risk for osteoporosis and fractures, ask your health care provider if you should:  Be screened for bone loss.  Take a calcium or vitamin D supplement to lower your risk of fractures.  Be given hormone replacement therapy (HRT) to treat symptoms of menopause. Follow these instructions at home: Lifestyle  Do not use any products that contain nicotine or tobacco, such as cigarettes, e-cigarettes, and chewing tobacco. If you need help quitting, ask your health care provider.  Do not use street drugs.  Do not share needles.  Ask your health care provider for help if you need support or information about quitting drugs. Alcohol use  Do not drink alcohol if: ? Your health care provider tells you not to drink. ? You are pregnant, may be pregnant, or are planning to become pregnant.  If you drink alcohol: ? Limit how much you use to 0-1 drink a day. ? Limit intake if you are breastfeeding.  Be aware of how much alcohol is in  your drink. In the [place] one drink equals one 12 oz bottle of beer (355 mL), one 5 oz glass of wine (148 mL), or one 1 oz glass of hard liquor (44 mL). General instructions  Schedule regular health, dental, and eye exams.  Stay current with your vaccines.  Tell your health care provider if: ? You often feel depressed. ? You have ever been abused or do not feel safe at home. Summary  Adopting a healthy lifestyle and getting preventive care are important in promoting health and wellness.  Follow your health care provider's instructions about healthy diet, exercising, and getting tested or screened for diseases.  Follow your health care provider's instructions on monitoring your cholesterol and blood pressure. This information is not intended to replace advice given to you by your health care provider. Make sure you discuss any questions you have with your health care provider. Document Released: 07/18/2010 Document Revised: 12/26/2017 Document Reviewed: 12/26/2017 Elsevier Patient Education  2020  [employer].

## 2018-11-02 LAB — SAR COV2 SEROLOGY (COVID19)AB(IGG),IA: SARS CoV2 AB IGG: NEGATIVE

## 2018-11-04 ENCOUNTER — Encounter: Payer: Self-pay | Admitting: Physician Assistant

## 2018-11-05 ENCOUNTER — Telehealth: Payer: Self-pay

## 2018-11-05 MED ORDER — FLUCONAZOLE 150 MG PO TABS: 150.0000 mg | ORAL_TABLET | Freq: Once | ORAL | 0 refills | Status: AC

## 2018-11-05 NOTE — Addendum Note (Signed)
Addended by: Marian Sorrow on: 11/05/2018 02:51 PM   Modules accepted: Orders

## 2018-11-05 NOTE — Telephone Encounter (Signed)
Spoke to pt told her Rx for Diflucan was sent to pharmacy as requested. Pt verbalized understanding. 

## 2018-11-05 NOTE — Telephone Encounter (Signed)
Copied from [place] [id number]. Topic: General - Inquiry >> Nov 05, 2018 12:21 PM Richardo Priest, [place] wrote: Reason for CRM: Patient called in stating she is experiencing yeast infection symptoms. Patient would like diflucan if possible. Please advise. [facility] able to [facility] [place], [place] - [address] [phone] (Phone) [phone] (Fax)

## 2018-11-12 ENCOUNTER — Other Ambulatory Visit: Payer: Self-pay | Admitting: Physician Assistant

## 2018-11-12 NOTE — Telephone Encounter (Signed)
Copied from [place] [id number]. Topic: Quick Communication - Rx Refill/Question >> Nov 12, 2018 11:32 AM Leward Quan A wrote: Medication: JUNEL FE 1/20 1-20 MG-MCG tablet   Has the patient contacted their pharmacy? Yes.   (Agent: If no, request that the patient contact the pharmacy for the refill.) (Agent: If yes, when and what did the pharmacy advise?)  Preferred Pharmacy (with phone number or street name): [facility] [place], [place] [address] [phone] (Phone) [phone] (Fax)    Agent: Please be advised that RX refills may take up to 3 business days. We ask that you follow-up with your pharmacy.

## 2018-11-13 ENCOUNTER — Other Ambulatory Visit: Payer: Self-pay

## 2018-11-13 ENCOUNTER — Telehealth: Payer: Self-pay | Admitting: Physician Assistant

## 2018-11-13 MED ORDER — NORETHIN ACE-ETH ESTRAD-FE 1-20 MG-MCG PO TABS: 1.0000 | ORAL_TABLET | Freq: Every day | ORAL | 11 refills | Status: DC

## 2018-11-13 NOTE — Telephone Encounter (Signed)
rx refill JUNEL FE 1/20 1-20 MG-MCG tablet  PHARMACY [facility] [place], [place] [address] [phone] (Phone) [phone] (Fax)

## 2018-11-13 NOTE — Telephone Encounter (Signed)
Rx sent to pharmacy   

## 2018-12-24 ENCOUNTER — Telehealth: Payer: Self-pay | Admitting: Physician Assistant

## 2018-12-24 NOTE — Telephone Encounter (Signed)
Pt last seen oct 2020. Pt is having vaginal itching and white discharge with smell. Pt does not like OTC and would like diflucan . Pt has had yeast infection in past and this feel like one she had in past. [facility] [address]

## 2018-12-24 NOTE — Telephone Encounter (Signed)
Called and spoke with pt, she said she will call us back to schedule.

## 2018-12-24 NOTE — Telephone Encounter (Signed)
Please call and schedule appointment.

## 2018-12-24 NOTE — Telephone Encounter (Signed)
Please call pt and schedule IN OFFICE visit per [facility].

## 2019-08-06 ENCOUNTER — Ambulatory Visit (INDEPENDENT_AMBULATORY_CARE_PROVIDER_SITE_OTHER): Payer: BC Managed Care – PPO | Admitting: Physician Assistant

## 2019-08-06 ENCOUNTER — Other Ambulatory Visit: Payer: Self-pay

## 2019-08-06 ENCOUNTER — Encounter: Payer: Self-pay | Admitting: Physician Assistant

## 2019-08-06 VITALS — BP 130/76 | HR 84 | Temp 98.4°F | Ht 65.0 in | Wt 145.0 lb

## 2019-08-06 DIAGNOSIS — R002 Palpitations: Secondary | ICD-10-CM | POA: Diagnosis not present

## 2019-08-06 DIAGNOSIS — M542 Cervicalgia: Secondary | ICD-10-CM

## 2019-08-06 NOTE — Patient Instructions (Signed)
It was great to see you!  Someone will contact you about wearing the monitor. After you wear and return the monitor, the cardiologist will review and interpret and I will be in touch.  Let me know if you'd like referral to orthopedist for your neck.   Take care,  Jarold Motto PA-C

## 2019-08-06 NOTE — Progress Notes (Signed)
Katrina Wood is a 24 y.o. female is here for follow up.  I acted as a Neurosurgeon for [employer], PA-C Corky Mull, LPN   History of Present Illness:   Chief Complaint  Patient presents with  . Palpitations    HPI   Palpitations Pt following up today was seen in the ED on 7/19 had a complete workup - this happened while she was traveling for a weeding. Had normal labs, EKG and chest xray. She reports she has had palpitations off and on for over a week now. She is having 3-4 episodes a day with SOB and chest tightness. Denies: syncope, nausea, vomiting.  Neck pain Went to physical therapy last summer for neck pain.  She had a normal xray in June 2020. She states that overall she is doing better but still has pain at times, and pain in her upper arms. Denies: numbness/tingling to arms, weakness, leg pain.   Health Maintenance Due  Topic Date Due  . Hepatitis C Screening  Never done  . CHLAMYDIA SCREENING  12/13/2018  . PAP-Cervical Cytology Screening  Never done  . PAP SMEAR-Modifier  Never done    History reviewed. No pertinent past medical history.   Social History   Tobacco Use  . Smoking status: Never Smoker  . Smokeless tobacco: Never Used  Vaping Use  . Vaping Use: Never used  Substance Use Topics  . Alcohol use: No  . Drug use: No    History reviewed. No pertinent surgical history.  Family History  Problem Relation Age of Onset  . Hyperlipidemia Mother   . Hypertension Mother   . Asthma Sister   . Hyperlipidemia Maternal Grandfather   . Hypertension Maternal Grandfather   . Heart attack Maternal Grandfather   . Stroke Paternal Grandfather     PMHx, SurgHx, SocialHx, FamHx, Medications, and Allergies were reviewed in the Visit Navigator and updated as appropriate.   Patient Active Problem List   Diagnosis Date Noted  . Interstitial cystitis 11/01/2018  . Anxiety 11/01/2018  . Chronic vulvovaginitis 03/30/2017    Social History   Tobacco  Use  . Smoking status: Never Smoker  . Smokeless tobacco: Never Used  Vaping Use  . Vaping Use: Never used  Substance Use Topics  . Alcohol use: No  . Drug use: No    Current Medications and Allergies:    Current Outpatient Medications:  .  BIOTIN PO, Take 1 tablet by mouth daily., Disp: , Rfl:  .  cetirizine (ZYRTEC) 10 MG tablet, Take 10 mg by mouth daily., Disp: , Rfl:  .  clobetasol ointment (TEMOVATE) 0.05 %, Apply thin layer as directed to skin two times per day for 4 weeks., Disp: 60 g, Rfl: 0 .  norethindrone-ethinyl estradiol (JUNEL FE 1/20) 1-20 MG-MCG tablet, Take 1 tablet by mouth daily., Disp: 28 tablet, Rfl: 11  No Known Allergies  Review of Systems   ROS  Negative unless otherwise specified per HPI.  Vitals:   Vitals:   08/06/19 1011  BP: 130/76  Pulse: 84  Temp: 98.4 F (36.9 C)  TempSrc: Temporal  SpO2: 99%  Weight: 145 lb (65.8 kg)  Height: 5\' 5"  (1.651 m)     Body mass index is 24.13 kg/m.   Physical Exam:    Physical Exam Vitals and nursing note reviewed.  Constitutional:      General: She is not in acute distress.    Appearance: She is well-developed. She is not ill-appearing or toxic-appearing.  Cardiovascular:     Rate and Rhythm: Normal rate and regular rhythm.     Pulses: Normal pulses.     Heart sounds: Normal heart sounds, S1 normal and S2 normal.     Comments: No LE edema Pulmonary:     Effort: Pulmonary effort is normal.     Breath sounds: Normal breath sounds.  Skin:    General: Skin is warm and dry.  Neurological:     Mental Status: She is alert.     GCS: GCS eye subscore is 4. GCS verbal subscore is 5. GCS motor subscore is 6.  Psychiatric:        Speech: Speech normal.        Behavior: Behavior normal. Behavior is cooperative.      Assessment and Plan:    Malynn was seen today for palpitations.  Diagnoses and all orders for this visit:  Palpitations Offered cardiology referral vs Zio patch. She is agreeable  to [facility] patch and then based upon results referral to cardiology if indicated. Worsening precautions advised. No red flags on discussion. -     LONG TERM MONITOR (3-14 DAYS); Future  Cervical pain (neck) She would like to defer further work-up until after palpitation work-up with monitor. Will likely refer to ortho for further evaluation and management.  . Reviewed expectations re: course of current medical issues. . Discussed self-management of symptoms. . Outlined signs and symptoms indicating need for more acute intervention. . Patient verbalized understanding and all questions were answered. . See orders for this visit as documented in the electronic medical record. . Patient received an After Visit Summary.  CMA or LPN served as scribe during this visit. History, Physical, and Plan performed by medical provider. The above documentation has been reviewed and is accurate and complete.  Time spent with patient today was 25 minutes which consisted of chart review, discussing diagnosis, work up, treatment answering questions and documentation.   Jarold Motto, PA-C [place], [place] 08/06/2019  Follow-up: No follow-ups on file.

## 2019-08-12 ENCOUNTER — Ambulatory Visit (INDEPENDENT_AMBULATORY_CARE_PROVIDER_SITE_OTHER): Payer: BC Managed Care – PPO

## 2019-08-12 DIAGNOSIS — R002 Palpitations: Secondary | ICD-10-CM | POA: Diagnosis not present

## 2019-08-14 ENCOUNTER — Telehealth: Payer: Self-pay | Admitting: Physician Assistant

## 2019-08-14 NOTE — Telephone Encounter (Signed)
Patient is calling in this morning wanting to confirm with Katrina Wood, how many days she is supposed to wear the heart monitor? Was told it was going to be a 14 day monitor but to wear it for 3 days, is just confused. Cleaster asked for a message to be sent through Mychart vs a phone call if possible.

## 2019-08-14 NOTE — Telephone Encounter (Signed)
Message sent thru My Chart as requested.

## 2019-08-19 ENCOUNTER — Other Ambulatory Visit: Payer: Self-pay

## 2019-08-19 ENCOUNTER — Ambulatory Visit (INDEPENDENT_AMBULATORY_CARE_PROVIDER_SITE_OTHER): Payer: BC Managed Care – PPO | Admitting: Physician Assistant

## 2019-08-19 ENCOUNTER — Encounter: Payer: Self-pay | Admitting: Physician Assistant

## 2019-08-19 VITALS — BP 110/68 | HR 71 | Temp 98.2°F | Ht 65.0 in | Wt 143.6 lb

## 2019-08-19 DIAGNOSIS — Z0000 Encounter for general adult medical examination without abnormal findings: Secondary | ICD-10-CM | POA: Diagnosis not present

## 2019-08-19 DIAGNOSIS — E871 Hypo-osmolality and hyponatremia: Secondary | ICD-10-CM | POA: Diagnosis not present

## 2019-08-19 DIAGNOSIS — R002 Palpitations: Secondary | ICD-10-CM | POA: Diagnosis not present

## 2019-08-19 DIAGNOSIS — M542 Cervicalgia: Secondary | ICD-10-CM

## 2019-08-19 LAB — BASIC METABOLIC PANEL
BUN: 9 mg/dL (ref 7–25)
CO2: 28 mmol/L (ref 20–32)
Calcium: 9.4 mg/dL (ref 8.6–10.2)
Chloride: 103 mmol/L (ref 98–110)
Creat: 0.74 mg/dL (ref 0.50–1.10)
Glucose, Bld: 83 mg/dL (ref 65–99)
Potassium: 4.1 mmol/L (ref 3.5–5.3)
Sodium: 138 mmol/L (ref 135–146)

## 2019-08-19 NOTE — Patient Instructions (Signed)
It was great to see you!  Stop monitor on Friday and send back -- I will be in touch with the results. Please reach out to me if you don't hear anything from ortho in 2 weeks or so.  Come back for PAP whenever.  Please go to the lab for blood work.   Our office will call you with your results unless you have chosen to receive results via MyChart.  If your blood work is normal we will follow-up each year for physicals and as scheduled for chronic medical problems.  If anything is abnormal we will treat accordingly and get you in for a follow-up.  Take care,  [facility] Maintenance, Female Adopting a healthy lifestyle and getting preventive care are important in promoting health and wellness. Ask your health care provider about:  The right schedule for you to have regular tests and exams.  Things you can do on your own to prevent diseases and keep yourself healthy. What should I know about diet, weight, and exercise? Eat a healthy diet   Eat a diet that includes plenty of vegetables, fruits, low-fat dairy products, and lean protein.  Do not eat a lot of foods that are high in solid fats, added sugars, or sodium. Maintain a healthy weight Body mass index (BMI) is used to identify weight problems. It estimates body fat based on height and weight. Your health care provider can help determine your BMI and help you achieve or maintain a healthy weight. Get regular exercise Get regular exercise. This is one of the most important things you can do for your health. Most adults should:  Exercise for at least 150 minutes each week. The exercise should increase your heart rate and make you sweat (moderate-intensity exercise).  Do strengthening exercises at least twice a week. This is in addition to the moderate-intensity exercise.  Spend less time sitting. Even light physical activity can be beneficial. Watch cholesterol and blood lipids Have your blood tested for lipids and  cholesterol at 22 years of age, then have this test every 5 years. Have your cholesterol levels checked more often if:  Your lipid or cholesterol levels are high.  You are older than 22 years of age.  You are at high risk for heart disease. What should I know about cancer screening? Depending on your health history and family history, you may need to have cancer screening at various ages. This may include screening for:  Breast cancer.  Cervical cancer.  Colorectal cancer.  Skin cancer.  Lung cancer. What should I know about heart disease, diabetes, and high blood pressure? Blood pressure and heart disease  High blood pressure causes heart disease and increases the risk of stroke. This is more likely to develop in people who have high blood pressure readings, are of African descent, or are overweight.  Have your blood pressure checked: ? Every 3-5 years if you are 31-37 years of age. ? Every year if you are 28 years old or older. Diabetes Have regular diabetes screenings. This checks your fasting blood sugar level. Have the screening done:  Once every three years after age 39 if you are at a normal weight and have a low risk for diabetes.  More often and at a younger age if you are overweight or have a high risk for diabetes. What should I know about preventing infection? Hepatitis B If you have a higher risk for hepatitis B, you should be screened for this virus. Talk  with your health care provider to find out if you are at risk for hepatitis B infection. Hepatitis C Testing is recommended for:  Everyone born from [birth year] through 1963.  Anyone with known risk factors for hepatitis C. Sexually transmitted infections (STIs)  Get screened for STIs, including gonorrhea and chlamydia, if: ? You are sexually active and are younger than 22 years of age. ? You are older than 22 years of age and your health care provider tells you that you are at risk for this type of  infection. ? Your sexual activity has changed since you were last screened, and you are at increased risk for chlamydia or gonorrhea. Ask your health care provider if you are at risk.  Ask your health care provider about whether you are at high risk for HIV. Your health care provider may recommend a prescription medicine to help prevent HIV infection. If you choose to take medicine to prevent HIV, you should first get tested for HIV. You should then be tested every 3 months for as long as you are taking the medicine. Pregnancy  If you are about to stop having your period (premenopausal) and you may become pregnant, seek counseling before you get pregnant.  Take 400 to 800 micrograms (mcg) of folic acid every day if you become pregnant.  Ask for birth control (contraception) if you want to prevent pregnancy. Osteoporosis and menopause Osteoporosis is a disease in which the bones lose minerals and strength with aging. This can result in bone fractures. If you are 29 years old or older, or if you are at risk for osteoporosis and fractures, ask your health care provider if you should:  Be screened for bone loss.  Take a calcium or vitamin D supplement to lower your risk of fractures.  Be given hormone replacement therapy (HRT) to treat symptoms of menopause. Follow these instructions at home: Lifestyle  Do not use any products that contain nicotine or tobacco, such as cigarettes, e-cigarettes, and chewing tobacco. If you need help quitting, ask your health care provider.  Do not use street drugs.  Do not share needles.  Ask your health care provider for help if you need support or information about quitting drugs. Alcohol use  Do not drink alcohol if: ? Your health care provider tells you not to drink. ? You are pregnant, may be pregnant, or are planning to become pregnant.  If you drink alcohol: ? Limit how much you use to 0-1 drink a day. ? Limit intake if you are  breastfeeding.  Be aware of how much alcohol is in your drink. In the [place] one drink equals one 12 oz bottle of beer (355 mL), one 5 oz glass of wine (148 mL), or one 1 oz glass of hard liquor (44 mL). General instructions  Schedule regular health, dental, and eye exams.  Stay current with your vaccines.  Tell your health care provider if: ? You often feel depressed. ? You have ever been abused or do not feel safe at home. Summary  Adopting a healthy lifestyle and getting preventive care are important in promoting health and wellness.  Follow your health care provider's instructions about healthy diet, exercising, and getting tested or screened for diseases.  Follow your health care provider's instructions on monitoring your cholesterol and blood pressure. This information is not intended to replace advice given to you by your health care provider. Make sure you discuss any questions you have with your health care provider. Document Revised: 12/26/2017  Document Reviewed: 12/26/2017 Elsevier Patient Education  [employer].

## 2019-08-19 NOTE — Progress Notes (Signed)
I acted as a Neurosurgeon for [employer], PA-C [place], [place]  Subjective:    Katrina Wood is a 24 y.o. female and is here for a comprehensive physical exam.  HPI  Health Maintenance Due  Topic Date Due  . Hepatitis C Screening  Never done  . CHLAMYDIA SCREENING  12/13/2018  . PAP-Cervical Cytology Screening  Never done  . PAP SMEAR-Modifier  Never done  . INFLUENZA VACCINE  08/17/2019    Acute Concerns: Hyponatremia -- incidental finding on her ER visit for palpitations. Sodium was 131. Will update today. Denies: HA, fatigue, nausea.  Chronic Issues: Palpitations -- she is wearing a heart monitor from our visit on 08/06/19 for palpitations. She reports that she doesn't feel like she has had any significant episodes since she started wearing her patch. Neck pain -- chronic. She has undergone PT without significant relief. Had normal Xray. She would like referral to ortho. Denies new issues. "Wake up with a crick in my neck" daily.  Health Maintenance: Immunizations -- UTD Colonoscopy -- N/A Mammogram -- N/A PAP -- delay due to patient preference Bone Density -- N/A Diet -- overall eats balanced meals; eats out occasionally Caffeine intake -- rare Sleep habits -- no concerns Exercise -- works out twice a week; has an active job (waitressing); hiking Current Weight -- Weight: 143 lb 9.6 oz (65.1 kg) -- normal for her Weight History: Wt Readings from Last 10 Encounters:  08/19/19 143 lb 9.6 oz (65.1 kg)  08/06/19 145 lb (65.8 kg)  11/01/18 143 lb (64.9 kg)  06/28/18 142 lb 4 oz (64.5 kg)  04/01/18 140 lb (63.5 kg)  12/12/17 139 lb (63 kg) (67 %, Z= 0.45)*  11/02/17 136 lb 8 oz (61.9 kg) (64 %, Z= 0.36)*  08/24/17 133 lb (60.3 kg) (59 %, Z= 0.23)*  07/27/17 136 lb (61.7 kg) (64 %, Z= 0.36)*  07/18/17 133 lb 4 oz (60.4 kg) (60 %, Z= 0.25)*   * Growth percentiles are based on CDC (Girls, 2-20 Years) data.   Body mass index is 23.9 kg/m. Mood -- sees counselor;  currently stable No LMP recorded. Period characteristics -- light Birth control -- currently on OCP  Depression screen PHQ 2/9 08/06/2019  Decreased Interest 0  Down, Depressed, Hopeless 0  PHQ - 2 Score 0    UTD with eye doctor; dentist  Other providers/specialists: Patient Care Team: Jarold Motto, [place] as PCP - General (Physician Assistant)   PMHx, SurgHx, SocialHx, Medications, and Allergies were reviewed in the Visit Navigator and updated as appropriate.   History reviewed. No pertinent past medical history.  History reviewed. No pertinent surgical history.   Family History  Problem Relation Age of Onset  . Hyperlipidemia Mother   . Hypertension Mother   . Asthma Sister   . Hyperlipidemia Maternal Grandfather   . Hypertension Maternal Grandfather   . Heart attack Maternal Grandfather   . Stroke Paternal Grandfather     Social History   Tobacco Use  . Smoking status: Never Smoker  . Smokeless tobacco: Never Used  Vaping Use  . Vaping Use: Never used  Substance Use Topics  . Alcohol use: Yes    Comment: 4 drinks every other weekend  . Drug use: No    Review of Systems:   Review of Systems  Constitutional: Negative for chills, fever, malaise/fatigue and weight loss.  HENT: Negative for hearing loss, sinus pain and sore throat.   Respiratory: Negative for cough and hemoptysis.  Cardiovascular: Negative for chest pain, palpitations, leg swelling and PND.  Gastrointestinal: Negative for abdominal pain, constipation, diarrhea, heartburn, nausea and vomiting.  Genitourinary: Negative for dysuria, frequency and urgency.  Musculoskeletal: Positive for neck pain. Negative for back pain and myalgias.  Skin: Negative for itching and rash.  Neurological: Negative for dizziness, tingling, seizures and headaches.  Endo/Heme/Allergies: Negative for polydipsia.  Psychiatric/Behavioral: Negative for depression. The patient is not nervous/anxious.     Objective:    BP 110/68 (BP Location: Left Arm, Patient Position: Sitting, Cuff Size: Normal)   Pulse 71   Temp 98.2 F (36.8 C) (Temporal)   Ht 5\' 5"  (1.651 m)   Wt 143 lb 9.6 oz (65.1 kg)   SpO2 98%   BMI 23.90 kg/m   General Appearance:    Alert, cooperative, no distress, appears stated age  Head:    Normocephalic, without obvious abnormality, atraumatic  Eyes:    PERRL, conjunctiva/corneas clear, EOM's intact, fundi    benign, both eyes  Ears:    Normal TM's and external ear canals, both ears  Nose:   Nares normal, septum midline, mucosa normal, no drainage    or sinus tenderness  Throat:   Lips, mucosa, and tongue normal; teeth and gums normal  Neck:   Supple, symmetrical, trachea midline, no adenopathy;    thyroid:  no enlargement/tenderness/nodules; no carotid   bruit or JVD  Back:     Symmetric, no curvature, ROM normal, no CVA tenderness  Lungs:     Clear to auscultation bilaterally, respirations unlabored  Chest Wall:    No tenderness or deformity   Heart:    Regular rate and rhythm, S1 and S2 normal, no murmur, rub   or gallop  Breast Exam:    Deferred  Abdomen:     Soft, non-tender, bowel sounds active all four quadrants,    no masses, no organomegaly  Genitalia:    Normal female without lesion, discharge or tenderness  Rectal:    Normal tone no masses or tenderness  Extremities:   Extremities normal, atraumatic, no cyanosis or edema  Pulses:   2+ and symmetric all extremities  Skin:   Skin color, texture, turgor normal, no rashes or lesions  Lymph nodes:   Cervical, supraclavicular, and axillary nodes normal  Neurologic:   CNII-XII intact, normal strength, sensation and reflexes    throughout     Assessment/Plan:   Katrina Wood was seen today for annual exam.  Diagnoses and all orders for this visit:  Routine physical examination Today patient counseled on age appropriate routine health concerns for screening and prevention, each reviewed and up to date or declined.  Immunizations reviewed and up to date or declined. Labs ordered and reviewed. Risk factors for depression reviewed and negative. Hearing function and visual acuity are intact. ADLs screened and addressed as needed. Functional ability and level of safety reviewed and appropriate. Education, counseling and referrals performed based on assessed risks today. Patient provided with a copy of personalized plan for preventive services.  She declined PAP today, will return at later date.  Hyponatremia Asymptomatic. Update BMP. -     Basic metabolic panel; Future  Palpitations Continue monitor, stop after day 10. Will contact patient with results.  Cervical pain (neck) Referral to ortho per patient request.   Well Adult Exam: Labs ordered: Yes. Patient counseling was done. See below for items discussed. Discussed the patient's BMI. The BMI is in the acceptable range Follow up in one year.  Patient Counseling:   [  x]    Nutrition: Stressed importance of moderation in sodium/caffeine intake, saturated fat and cholesterol, caloric balance, sufficient intake of fresh fruits, vegetables, fiber, calcium, iron, and 1 mg of folate supplement per day (for females capable of pregnancy).   [x]      Stressed the importance of regular exercise.    [x]     Substance Abuse: Discussed cessation/primary prevention of tobacco, alcohol, or other drug use; driving or other dangerous activities under the influence; availability of treatment for abuse.    [x]      Injury prevention: Discussed safety belts, safety helmets, smoke detector, smoking near bedding or upholstery.    [x]      Sexuality: Discussed sexually transmitted diseases, partner selection, use of condoms, avoidance of unintended pregnancy  and contraceptive alternatives.    [x]     Dental health: Discussed importance of regular tooth brushing, flossing, and dental visits.   [x]      Health maintenance and immunizations reviewed. Please refer to  Health maintenance section.   CMA or LPN served as scribe during this visit. History, Physical, and Plan performed by medical provider. The above documentation has been reviewed and is accurate and complete.   , PA-C [facility]

## 2019-09-28 ENCOUNTER — Other Ambulatory Visit: Payer: Self-pay | Admitting: Physician Assistant

## 2020-04-12 ENCOUNTER — Encounter: Payer: Self-pay | Admitting: Physician Assistant

## 2020-04-12 ENCOUNTER — Ambulatory Visit: Payer: BC Managed Care – PPO | Admitting: Physician Assistant

## 2020-04-12 ENCOUNTER — Other Ambulatory Visit: Payer: Self-pay

## 2020-04-12 VITALS — BP 118/76 | HR 84 | Temp 98.0°F | Ht 65.0 in | Wt 141.4 lb

## 2020-04-12 DIAGNOSIS — L659 Nonscarring hair loss, unspecified: Secondary | ICD-10-CM

## 2020-04-12 DIAGNOSIS — R6882 Decreased libido: Secondary | ICD-10-CM | POA: Diagnosis not present

## 2020-04-12 DIAGNOSIS — Z3009 Encounter for other general counseling and advice on contraception: Secondary | ICD-10-CM

## 2020-04-12 DIAGNOSIS — E538 Deficiency of other specified B group vitamins: Secondary | ICD-10-CM

## 2020-04-12 LAB — CBC WITH DIFFERENTIAL/PLATELET
Basophils Absolute: 0 10*3/uL (ref 0.0–0.1)
Basophils Relative: 0.5 % (ref 0.0–3.0)
Eosinophils Absolute: 0.3 10*3/uL (ref 0.0–0.7)
Eosinophils Relative: 5.1 % — ABNORMAL HIGH (ref 0.0–5.0)
HCT: 38.1 % (ref 36.0–46.0)
Hemoglobin: 12.8 g/dL (ref 12.0–15.0)
Lymphocytes Relative: 37.4 % (ref 12.0–46.0)
Lymphs Abs: 2.4 10*3/uL (ref 0.7–4.0)
MCHC: 33.5 g/dL (ref 30.0–36.0)
MCV: 81 fl (ref 78.0–100.0)
Monocytes Absolute: 0.7 10*3/uL (ref 0.1–1.0)
Monocytes Relative: 10.3 % (ref 3.0–12.0)
Neutro Abs: 3 10*3/uL (ref 1.4–7.7)
Neutrophils Relative %: 46.7 % (ref 43.0–77.0)
Platelets: 313 10*3/uL (ref 150.0–400.0)
RBC: 4.71 Mil/uL (ref 3.87–5.11)
RDW: 13.4 % (ref 11.5–15.5)
WBC: 6.4 10*3/uL (ref 4.0–10.5)

## 2020-04-12 LAB — COMPREHENSIVE METABOLIC PANEL
ALT: 12 U/L (ref 0–35)
AST: 15 U/L (ref 0–37)
Albumin: 4 g/dL (ref 3.5–5.2)
Alkaline Phosphatase: 52 U/L (ref 39–117)
BUN: 9 mg/dL (ref 6–23)
CO2: 26 mEq/L (ref 19–32)
Calcium: 9.2 mg/dL (ref 8.4–10.5)
Chloride: 102 mEq/L (ref 96–112)
Creatinine, Ser: 0.71 mg/dL (ref 0.40–1.20)
GFR: 120.77 mL/min (ref >60.00)
Glucose, Bld: 96 mg/dL (ref 70–99)
Potassium: 4.1 mEq/L (ref 3.5–5.1)
Sodium: 136 mEq/L (ref 135–145)
Total Bilirubin: 0.2 mg/dL (ref 0.2–1.2)
Total Protein: 6.9 g/dL (ref 6.0–8.3)

## 2020-04-12 LAB — TSH: TSH: 2.33 u[IU]/mL (ref 0.35–4.50)

## 2020-04-12 LAB — VITAMIN B12: Vitamin B-12: 428 pg/mL (ref 211–911)

## 2020-04-12 LAB — T4, FREE: Free T4: 0.81 ng/dL (ref 0.60–1.60)

## 2020-04-12 LAB — FERRITIN: Ferritin: 20.4 ng/mL (ref 10.0–291.0)

## 2020-04-12 LAB — IRON: Iron: 53 ug/dL (ref 42–145)

## 2020-04-12 NOTE — Progress Notes (Signed)
Katrina Wood is a 25 y.o. female here for a new problem.  History of Present Illness:   Chief Complaint  Patient presents with  . Hypothyroidism    Family history of this. Has been having some hair loss over the past few months, hair has been coming out in clumps feels more fatigue than usual. Would like labs done.     HPI  Concerns about thyroid/hair loss Family history of hypothyroidism. Has been having some hair loss over the past few months, hair has been coming out in clumps and feels more fatigue than usual. Would like labs done. She did have COVID in December and noticed it after this. Doesn't feel like her stress level has been out of the ordinary. She is taking biotin regularly. Takes oral iron weekly and has iron in her OCP that she takes regularly.  B12 deficiency Has had decreased B12 levels in the past and would like this updated today.  Decreased libido Interested in switching to IUD possibly. Likes her OCP but thinks that it could be a factor with her libido. Denies concerns with current relationship.  History reviewed. No pertinent past medical history.   Social History   Tobacco Use  . Smoking status: Never Smoker  . Smokeless tobacco: Never Used  Vaping Use  . Vaping Use: Never used  Substance Use Topics  . Alcohol use: Yes    Comment: 4 drinks every other weekend  . Drug use: No    History reviewed. No pertinent surgical history.  Family History  Problem Relation Age of Onset  . Hyperlipidemia Mother   . Hypertension Mother   . Asthma Sister   . Hyperlipidemia Maternal Grandfather   . Hypertension Maternal Grandfather   . Heart attack Maternal Grandfather   . Stroke Paternal Grandfather     No Known Allergies  Current Medications:   Current Outpatient Medications:  .  BIOTIN PO, Take 1 tablet by mouth daily., Disp: , Rfl:  .  cetirizine (ZYRTEC) 10 MG tablet, Take 10 mg by mouth daily., Disp: , Rfl:  .  clobetasol ointment (TEMOVATE) 0.05  %, Apply thin layer as directed to skin two times per day for 4 weeks., Disp: 60 g, Rfl: 0 .  ferrous sulfate 325 (65 FE) MG EC tablet, Take 325 mg by mouth once a week., Disp: , Rfl:  .  JUNEL FE 1/20 1-20 MG-MCG tablet, TAKE 1 TABLET BY MOUTH EVERY DAY, Disp: 84 tablet, Rfl: 3   Review of Systems:   ROS  Negative unless otherwise specified per HPI.  Vitals:   Vitals:   04/12/20 1040  BP: 118/76  Pulse: 84  Temp: 98 F (36.7 C)  TempSrc: Temporal  SpO2: 98%  Weight: 141 lb 6.4 oz (64.1 kg)  Height: 5\' 5"  (1.651 m)     Body mass index is 23.53 kg/m.  Physical Exam:   Physical Exam Vitals and nursing note reviewed.  Constitutional:      General: She is not in acute distress.    Appearance: She is well-developed. She is not ill-appearing or toxic-appearing.  Cardiovascular:     Rate and Rhythm: Normal rate and regular rhythm.     Pulses: Normal pulses.     Heart sounds: Normal heart sounds, S1 normal and S2 normal.     Comments: No LE edema Pulmonary:     Effort: Pulmonary effort is normal.     Breath sounds: Normal breath sounds.  Skin:    General: Skin is warm  and dry.     Comments: Slight thinning of hair at bilateral temporal regions  Neurological:     Mental Status: She is alert.     GCS: GCS eye subscore is 4. GCS verbal subscore is 5. GCS motor subscore is 6.  Psychiatric:        Speech: Speech normal.        Behavior: Behavior normal. Behavior is cooperative.       Assessment and Plan:   Jernie was seen today for hypothyroidism.  Diagnoses and all orders for this visit:  Hair loss Possibly related to recent COVID infection. Update blood work. Consider rogaine or dermatology referral. Follow up based on clinical symptoms and blood work results. -     TSH -     T4, free -     Iron -     Ferritin -     CBC with Differential/Platelet -     Comprehensive metabolic panel  B12 deficiency Update B12 levels and provide recommendations  accordingly. -     Vitamin B12  Decreased libido; Counseling for birth control regarding intrauterine device (IUD) -     Ambulatory referral to Obstetrics / Gynecology   Jarold Motto, PA-C

## 2020-04-12 NOTE — Patient Instructions (Signed)
It was great to see you!  Referral for gynecology placed.  We will update blood work today. Rogaine is typically the only proven treatment for hair loss. You could trial this if needed, it is available over the counter. Hair loss could definitely be related to COVID.  Take care,  Jarold Motto PA-C

## 2020-05-08 IMAGING — DX CERVICAL SPINE - COMPLETE 4+ VIEW
5 series · 5 of 5 positions shown · non-contrast
Comparison: None.

CLINICAL DATA: Cervical spine pain for 1 year.  No known injury.

EXAM:
CERVICAL SPINE - COMPLETE 4+ VIEW

[cervical spine lat]
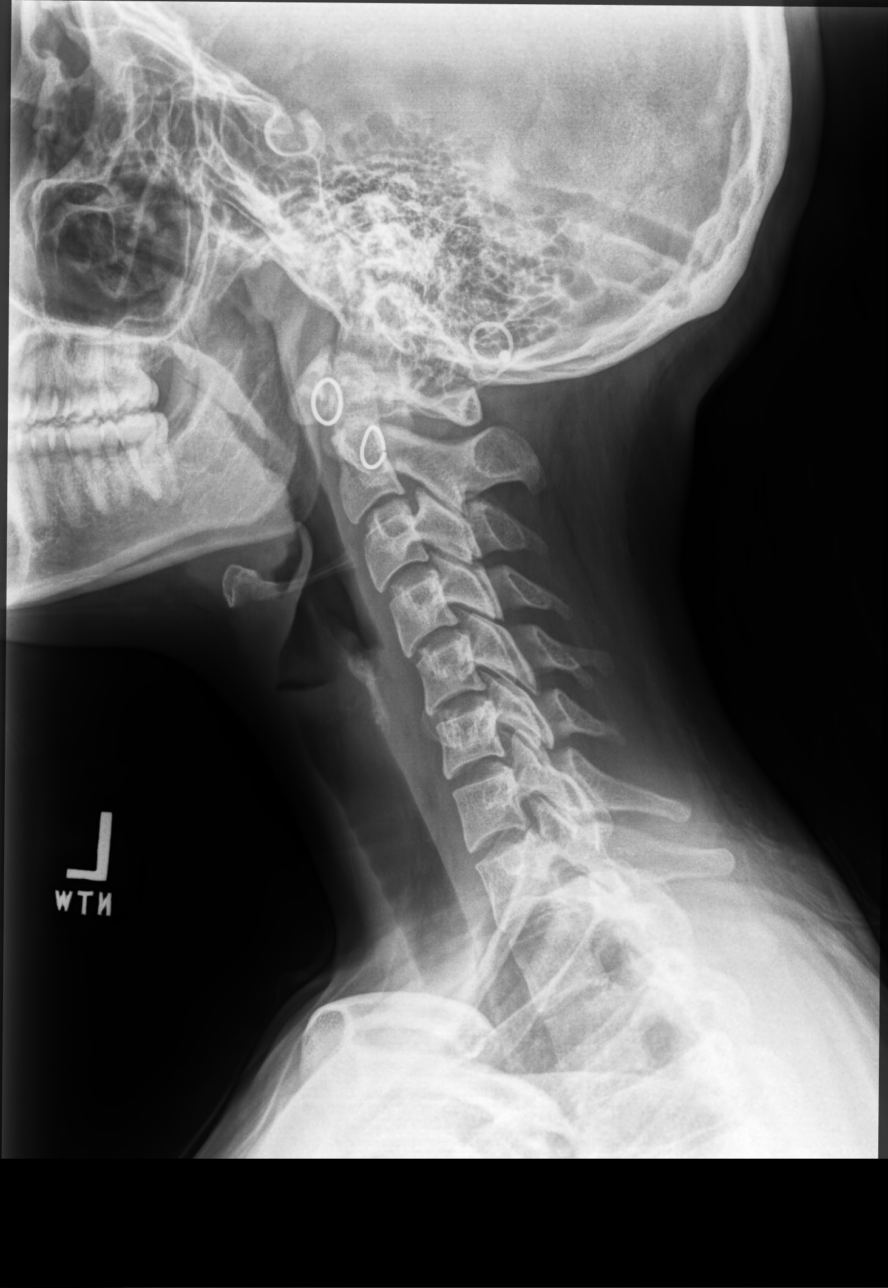

[cervical spine oblique (1 of 2)]
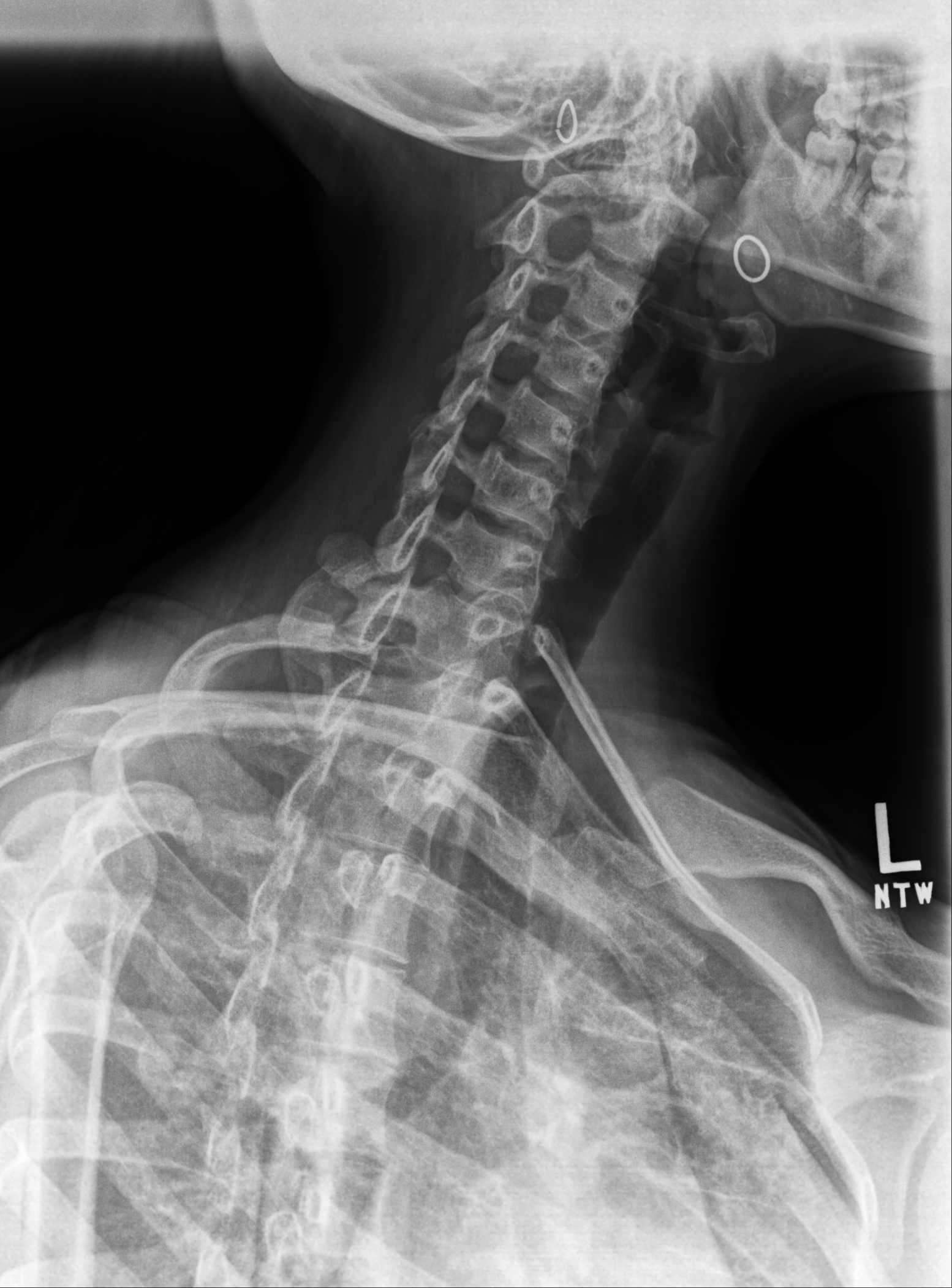

[cervical spine oblique (2 of 2)]
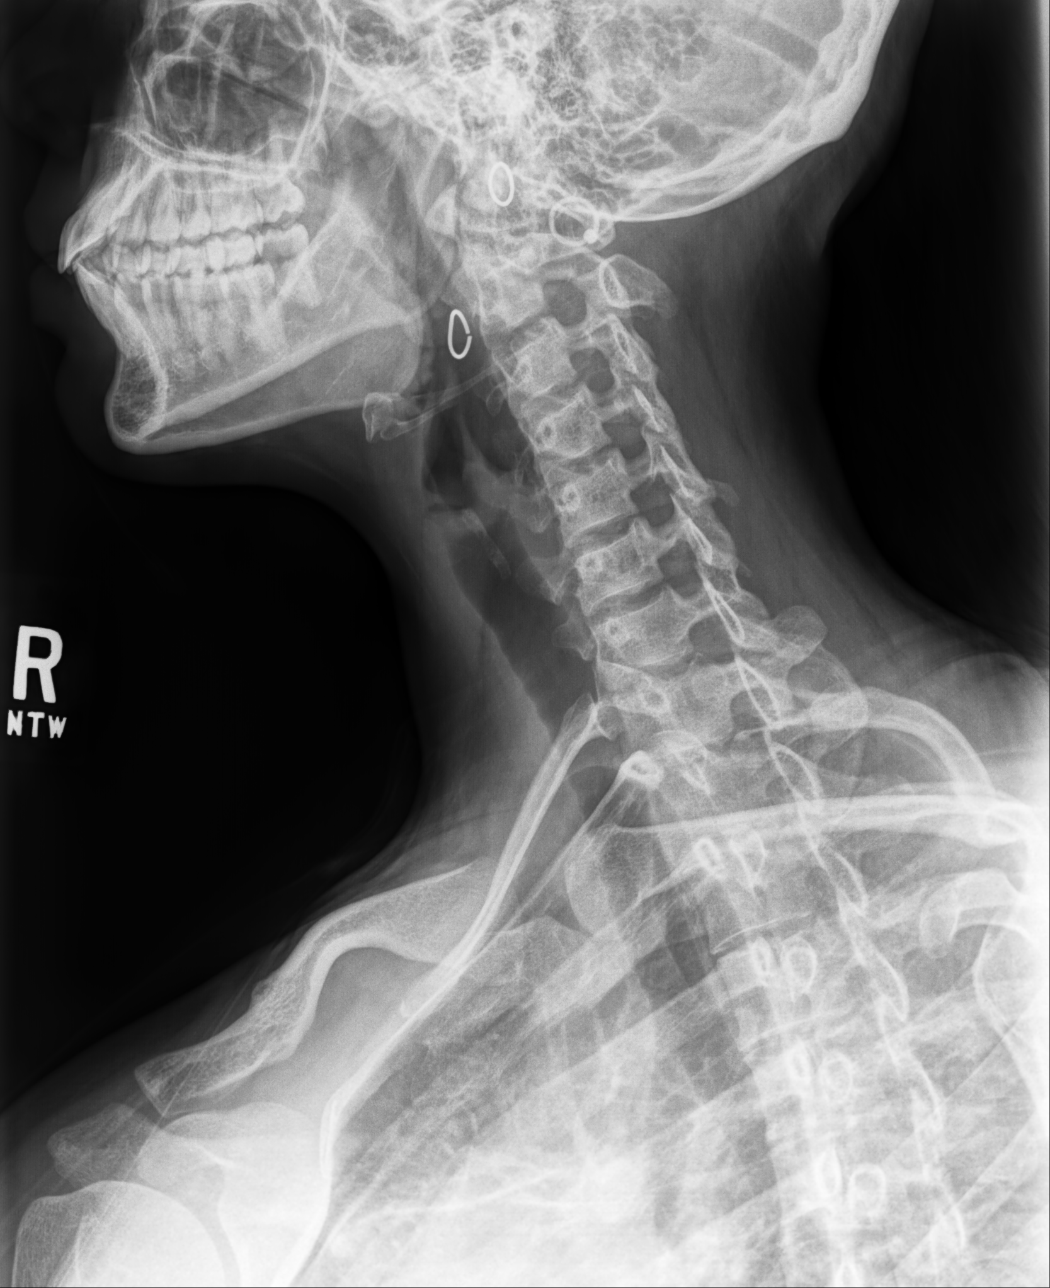

[cervical spine ap (1 of 2)]
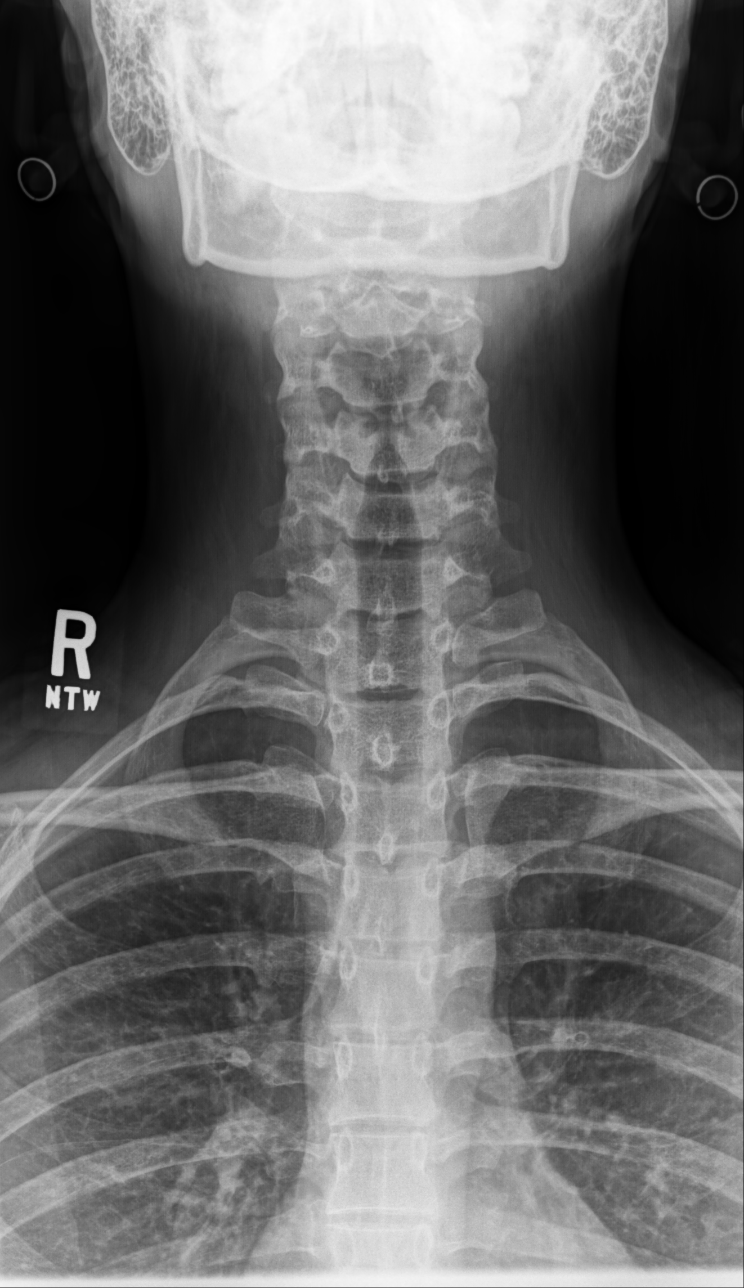

[cervical spine ap (2 of 2)]
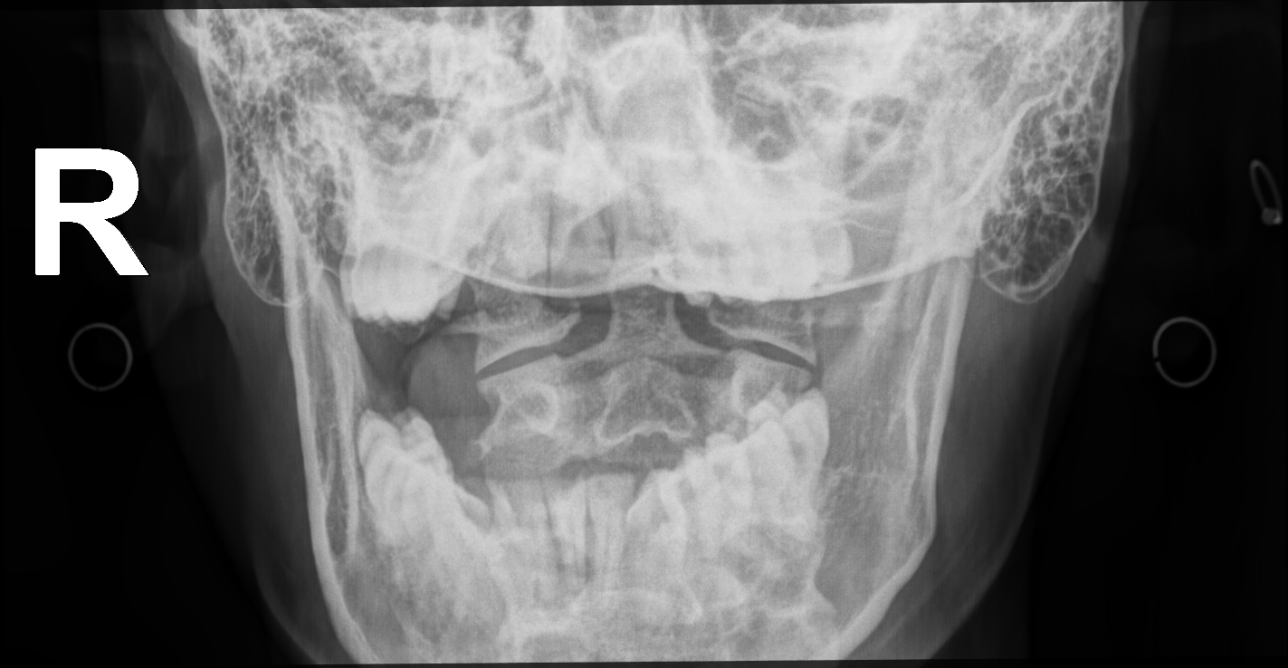

[5 of 5 positions shown; findings below may reference images not displayed]

FINDINGS: There is no evidence of cervical spine fracture or prevertebral soft
tissue swelling. Alignment is normal. No other significant bone
abnormalities are identified.
IMPRESSION: Negative cervical spine radiographs.

## 2020-06-22 ENCOUNTER — Other Ambulatory Visit: Payer: Self-pay

## 2020-06-22 DIAGNOSIS — Z1159 Encounter for screening for other viral diseases: Secondary | ICD-10-CM

## 2020-06-22 DIAGNOSIS — Z111 Encounter for screening for respiratory tuberculosis: Secondary | ICD-10-CM

## 2020-06-29 ENCOUNTER — Other Ambulatory Visit: Payer: Self-pay

## 2020-06-29 ENCOUNTER — Other Ambulatory Visit (INDEPENDENT_AMBULATORY_CARE_PROVIDER_SITE_OTHER): Payer: BC Managed Care – PPO

## 2020-06-29 DIAGNOSIS — Z1159 Encounter for screening for other viral diseases: Secondary | ICD-10-CM | POA: Diagnosis not present

## 2020-06-29 DIAGNOSIS — Z111 Encounter for screening for respiratory tuberculosis: Secondary | ICD-10-CM

## 2020-06-30 ENCOUNTER — Ambulatory Visit (INDEPENDENT_AMBULATORY_CARE_PROVIDER_SITE_OTHER): Payer: BC Managed Care – PPO | Admitting: *Deleted

## 2020-06-30 ENCOUNTER — Other Ambulatory Visit: Payer: Self-pay

## 2020-06-30 ENCOUNTER — Encounter: Payer: Self-pay | Admitting: *Deleted

## 2020-06-30 DIAGNOSIS — Z23 Encounter for immunization: Secondary | ICD-10-CM

## 2020-06-30 NOTE — Progress Notes (Signed)
Per orders of Dr. Jimmey Ralph, injection of Tdap vaccine 0.5 ml  given IM by Corky Mull, LPN in left deltoid. Patient tolerated injection well.

## 2020-07-02 ENCOUNTER — Telehealth: Payer: Self-pay

## 2020-07-02 LAB — QUANTIFERON-TB GOLD PLUS
Mitogen-NIL: 7.37 IU/mL
NIL: 0.03 IU/mL
QuantiFERON-TB Gold Plus: NEGATIVE
TB1-NIL: 0.01 IU/mL
TB2-NIL: 0 IU/mL

## 2020-07-02 LAB — HEPATITIS B CORE ANTIBODY, IGM: Hep B C IgM: NONREACTIVE

## 2020-07-02 NOTE — Telephone Encounter (Signed)
Patient is calling in stating that she has to turn the results of her TB and Hep A in to school on Wednesday and is wondering if she can get a call the moment the results are in.

## 2020-07-05 ENCOUNTER — Other Ambulatory Visit: Payer: Self-pay | Admitting: *Deleted

## 2020-07-05 DIAGNOSIS — Z0184 Encounter for antibody response examination: Secondary | ICD-10-CM

## 2020-07-05 NOTE — Telephone Encounter (Signed)
Left message on voicemail to call office. Paperwork completed and emailed to patient.

## 2020-07-05 NOTE — Telephone Encounter (Signed)
Spoke to pt told her the correct test was not ordered to show immunity. I have asked the lab to add test on and will hopefully be back tomorrow. So message just popped up from lab can not add test on. Asked pt if she can come in tomorrow to have blood drawn? Pt said no she is still in [place]. Told her okay I will change appt on 6/23 with Nurse to Lab appt only to have blood drawn. Pt verbalized understanding.

## 2020-07-05 NOTE — Telephone Encounter (Signed)
Patient called in returning call, gave message below. Katrina Wood states her Hep B was non reactive, and school requires it to be reactive so she scheduled another Nurse visit.

## 2020-07-05 NOTE — Telephone Encounter (Signed)
Left message on voicemail to call office.  

## 2020-07-06 ENCOUNTER — Telehealth: Payer: Self-pay

## 2020-07-06 NOTE — Telephone Encounter (Signed)
Patient is calling in stating that her director at school is okay with the results from the Hep B, and with it being non-reactive. They told Katrina Wood to just go ahead and proceed with a booster shot she doesn't have to get more blood drawn. Is it okay to switch lab visit to nurse?

## 2020-07-06 NOTE — Progress Notes (Signed)
Please inform patient of the following:  Hep C and TB tests are both negative.  Katina Degree. Jimmey Ralph, MD 07/06/2020 5:15 PM

## 2020-07-08 ENCOUNTER — Other Ambulatory Visit (INDEPENDENT_AMBULATORY_CARE_PROVIDER_SITE_OTHER): Payer: BC Managed Care – PPO

## 2020-07-08 ENCOUNTER — Other Ambulatory Visit: Payer: Self-pay

## 2020-07-08 ENCOUNTER — Ambulatory Visit: Payer: BC Managed Care – PPO

## 2020-07-08 ENCOUNTER — Other Ambulatory Visit: Payer: BC Managed Care – PPO

## 2020-07-08 DIAGNOSIS — Z0184 Encounter for antibody response examination: Secondary | ICD-10-CM | POA: Diagnosis not present

## 2020-07-08 NOTE — Telephone Encounter (Signed)
Pt presented to office for lab work today to check for immunity to Hep B. Katie explained to pt why lab work needs to be done and can not just give booster. Pt verbalized understanding and had lab work drawn.

## 2020-07-09 LAB — HEPATITIS B SURFACE ANTIBODY, QUANTITATIVE: Hep B S AB Quant (Post): <5 m[IU]/mL — ABNORMAL LOW (ref >=10)

## 2020-07-09 NOTE — Progress Notes (Signed)
Please inform patient of the following:  Her hep B antibody is negative. Looks like she has already had the hep B series. In this case, it is recommended that she come in for a second 3 dose vaccine series.  Katina Degree. Jimmey Ralph, MD 07/09/2020 2:28 PM

## 2020-07-12 ENCOUNTER — Other Ambulatory Visit: Payer: Self-pay

## 2020-07-12 ENCOUNTER — Encounter: Payer: Self-pay | Admitting: *Deleted

## 2020-07-12 ENCOUNTER — Ambulatory Visit (INDEPENDENT_AMBULATORY_CARE_PROVIDER_SITE_OTHER): Payer: BC Managed Care – PPO | Admitting: *Deleted

## 2020-07-12 DIAGNOSIS — Z23 Encounter for immunization: Secondary | ICD-10-CM | POA: Diagnosis not present

## 2020-07-12 NOTE — Progress Notes (Signed)
Per orders of Dr. Jimmey Ralph, injection of Heplisav-B (Hep B) vaccine given IM by Corky Mull, LPN in left deltoid. Patient tolerated injection well. Patient will make appointment for 1 month for next injection.

## 2020-08-12 ENCOUNTER — Ambulatory Visit: Payer: BC Managed Care – PPO

## 2020-08-31 ENCOUNTER — Other Ambulatory Visit: Payer: Self-pay

## 2020-08-31 ENCOUNTER — Other Ambulatory Visit: Payer: Self-pay | Admitting: Physician Assistant

## 2020-10-03 ENCOUNTER — Encounter: Payer: Self-pay | Admitting: Physician Assistant

## 2020-10-03 DIAGNOSIS — L659 Nonscarring hair loss, unspecified: Secondary | ICD-10-CM

## 2020-10-04 NOTE — Telephone Encounter (Signed)
Okay to place the referral for Dermatology?

## 2020-10-11 NOTE — Telephone Encounter (Signed)
Lisa, please see message. 

## 2020-12-08 ENCOUNTER — Ambulatory Visit (INDEPENDENT_AMBULATORY_CARE_PROVIDER_SITE_OTHER): Payer: BC Managed Care – PPO | Admitting: Physician Assistant

## 2020-12-08 ENCOUNTER — Other Ambulatory Visit (HOSPITAL_COMMUNITY)
Admission: RE | Admit: 2020-12-08 | Discharge: 2020-12-08 | Disposition: A | Discharge status: Home / Self Care | Payer: BC Managed Care – PPO | Source: Ambulatory Visit | Attending: Physician Assistant | Admitting: Physician Assistant

## 2020-12-08 ENCOUNTER — Encounter: Payer: Self-pay | Admitting: Physician Assistant

## 2020-12-08 ENCOUNTER — Telehealth: Payer: Self-pay

## 2020-12-08 ENCOUNTER — Other Ambulatory Visit: Payer: Self-pay

## 2020-12-08 VITALS — BP 110/70 | HR 75 | Temp 98.0°F | Ht 65.0 in | Wt 136.2 lb

## 2020-12-08 DIAGNOSIS — Z0001 Encounter for general adult medical examination with abnormal findings: Secondary | ICD-10-CM | POA: Diagnosis not present

## 2020-12-08 DIAGNOSIS — Z13220 Encounter for screening for lipoid disorders: Secondary | ICD-10-CM | POA: Diagnosis not present

## 2020-12-08 DIAGNOSIS — Z136 Encounter for screening for cardiovascular disorders: Secondary | ICD-10-CM | POA: Diagnosis not present

## 2020-12-08 DIAGNOSIS — Z124 Encounter for screening for malignant neoplasm of cervix: Secondary | ICD-10-CM | POA: Insufficient documentation

## 2020-12-08 DIAGNOSIS — L659 Nonscarring hair loss, unspecified: Secondary | ICD-10-CM | POA: Diagnosis not present

## 2020-12-08 LAB — COMPREHENSIVE METABOLIC PANEL
ALT: 12 U/L (ref 0–35)
AST: 19 U/L (ref 0–37)
Albumin: 4 g/dL (ref 3.5–5.2)
Alkaline Phosphatase: 54 U/L (ref 39–117)
BUN: 10 mg/dL (ref 6–23)
CO2: 25 mEq/L (ref 19–32)
Calcium: 9.3 mg/dL (ref 8.4–10.5)
Chloride: 103 mEq/L (ref 96–112)
Creatinine, Ser: 0.7 mg/dL (ref 0.40–1.20)
GFR: 122.28 mL/min (ref >60.00)
Glucose, Bld: 85 mg/dL (ref 70–99)
Potassium: 3.9 mEq/L (ref 3.5–5.1)
Sodium: 136 mEq/L (ref 135–145)
Total Bilirubin: 0.3 mg/dL (ref 0.2–1.2)
Total Protein: 7.1 g/dL (ref 6.0–8.3)

## 2020-12-08 LAB — CBC WITH DIFFERENTIAL/PLATELET
Basophils Absolute: 0 10*3/uL (ref 0.0–0.1)
Basophils Relative: 0.5 % (ref 0.0–3.0)
Eosinophils Absolute: 0.1 10*3/uL (ref 0.0–0.7)
Eosinophils Relative: 1.1 % (ref 0.0–5.0)
HCT: 36.6 % (ref 36.0–46.0)
Hemoglobin: 12.2 g/dL (ref 12.0–15.0)
Lymphocytes Relative: 43.6 % (ref 12.0–46.0)
Lymphs Abs: 2.9 10*3/uL (ref 0.7–4.0)
MCHC: 33.3 g/dL (ref 30.0–36.0)
MCV: 79.3 fl (ref 78.0–100.0)
Monocytes Absolute: 0.4 10*3/uL (ref 0.1–1.0)
Monocytes Relative: 5.7 % (ref 3.0–12.0)
Neutro Abs: 3.3 10*3/uL (ref 1.4–7.7)
Neutrophils Relative %: 49.1 % (ref 43.0–77.0)
Platelets: 346 10*3/uL (ref 150.0–400.0)
RBC: 4.62 Mil/uL (ref 3.87–5.11)
RDW: 13.1 % (ref 11.5–15.5)
WBC: 6.7 10*3/uL (ref 4.0–10.5)

## 2020-12-08 LAB — LIPID PANEL
Cholesterol: 146 mg/dL (ref 0–200)
HDL: 43.3 mg/dL (ref >39.00)
LDL Cholesterol: 79 mg/dL (ref 0–99)
NonHDL: 102.68
Total CHOL/HDL Ratio: 3
Triglycerides: 117 mg/dL (ref 0.0–149.0)
VLDL: 23.4 mg/dL (ref 0.0–40.0)

## 2020-12-08 LAB — IBC + FERRITIN
Ferritin: 18.4 ng/mL (ref 10.0–291.0)
Iron: 131 ug/dL (ref 42–145)
Saturation Ratios: 26.4 % (ref 20.0–50.0)
TIBC: 495.6 ug/dL — ABNORMAL HIGH (ref 250.0–450.0)
Transferrin: 354 mg/dL (ref 212.0–360.0)

## 2020-12-08 NOTE — Telephone Encounter (Signed)
error 

## 2020-12-08 NOTE — Progress Notes (Signed)
Subjective:    Katrina Wood is a 25 y.o. female and is here for a comprehensive physical exam.  HPI  Health Maintenance Due  Topic Date Due   Hepatitis C Screening  Never done   CHLAMYDIA SCREENING  12/13/2018   PAP-Cervical Cytology Screening  Never done   PAP SMEAR-Modifier  Never done   COVID-19 Vaccine (4 - Booster for Moderna series) 02/25/2020   Acute Concerns: Hair Loss Pt expresses she is still experiencing hair loss but feels like it has slowed down. She has tried using rosemary products on her hair and has found this to be beneficial. Katrina Wood states she was unable to follow up with previously referred dermatologist due to no availabilities. She is interested in receiving a new referral to dermatology.   Currently compliant with taking ferrous sulfate 325 mg once a week with no adverse effects. She is managing well but fine with increasing frequency of dosage if needed.   Chronic Issues: None  Health Maintenance: Immunizations -- Covid- UTD 12/31/19(Moderna) Influenza- Due at this time Tdap- UTD as of 06/30/20 PAP -- Due at this time Bone Density -- N/A Diet -- Balanced meals  Sleep habits -- Normal schedule Exercise -- 2-3 times a week  Ophthalmology- Not UTD Dentistry - UTD Current Weight -- Stable Weight History: Wt Readings from Last 10 Encounters:  12/08/20 136 lb 4 oz (61.8 kg)  04/12/20 141 lb 6.4 oz (64.1 kg)  08/19/19 143 lb 9.6 oz (65.1 kg)  08/06/19 145 lb (65.8 kg)  11/01/18 143 lb (64.9 kg)  06/28/18 142 lb 4 oz (64.5 kg)  04/01/18 140 lb (63.5 kg)  12/12/17 139 lb (63 kg) (67 %, Z= 0.45)*  11/02/17 136 lb 8 oz (61.9 kg) (64 %, Z= 0.36)*  08/24/17 133 lb (60.3 kg) (59 %, Z= 0.23)*   * Growth percentiles are based on CDC (Girls, 2-20 Years) data.   Body mass index is 22.67 kg/m. Mood -- Stable  Patient's last menstrual period was 11/22/2020 (approximate). Period characteristics -- Normal Birth control -- Junel FE 1-20 mg daily      reports current alcohol use.  Tobacco Use: Low Risk    Smoking Tobacco Use: Never   Smokeless Tobacco Use: Never   Passive Exposure: Not on file     Depression screen [facility] 2/9 12/08/2020  Decreased Interest 0  Down, Depressed, Hopeless 0  PHQ - 2 Score 0     Other providers/specialists: Patient Care Team: Jarold Motto, [place] as PCP - General (Physician Assistant)   PMHx, SurgHx, SocialHx, Medications, and Allergies were reviewed in the Visit Navigator and updated as appropriate.   History reviewed. No pertinent past medical history.  History reviewed. No pertinent surgical history.   Family History  Problem Relation Age of Onset   Hyperlipidemia Mother    Hypertension Mother    Asthma Sister    Hyperlipidemia Maternal Grandfather    Hypertension Maternal Grandfather    Heart attack Maternal Grandfather    Stroke Paternal Grandfather    Cancer Neg Hx     Social History   Tobacco Use   Smoking status: Never   Smokeless tobacco: Never  Vaping Use   Vaping Use: Never used  Substance Use Topics   Alcohol use: Yes    Comment: 4 drinks every other weekend   Drug use: No    Review of Systems:   Review of Systems  Constitutional:  Negative for chills, fever, malaise/fatigue and weight loss.  HENT:  Negative for hearing loss, sinus pain and sore throat.   Respiratory:  Negative for cough and hemoptysis.   Cardiovascular:  Negative for chest pain, palpitations, leg swelling and PND.  Gastrointestinal:  Negative for abdominal pain, constipation, diarrhea, heartburn, nausea and vomiting.  Genitourinary:  Negative for dysuria, frequency and urgency.  Musculoskeletal:  Negative for back pain, myalgias and neck pain.  Skin:  Negative for itching and rash.  Neurological:  Negative for dizziness, tingling, seizures and headaches.  Endo/Heme/Allergies:  Negative for polydipsia.  Psychiatric/Behavioral:  Negative for depression. The patient is not nervous/anxious.     Objective:   BP 110/70 (BP Location: Left Arm, Patient Position: Sitting, Cuff Size: Normal)   Pulse 75   Temp 98 F (36.7 C) (Temporal)   Ht 5\' 5"  (1.651 m)   Wt 136 lb 4 oz (61.8 kg)   LMP 11/22/2020 (Approximate)   SpO2 98%   BMI 22.67 kg/m   General Appearance:    Alert, cooperative, no distress, appears stated age  Head:    Normocephalic, without obvious abnormality, atraumatic  Eyes:    PERRL, conjunctiva/corneas clear, EOM's intact, fundi    benign, both eyes  Ears:    Normal TM's and external ear canals, both ears  Nose:   Nares normal, septum midline, mucosa normal, no drainage    or sinus tenderness  Throat:   Lips, mucosa, and tongue normal; teeth and gums normal  Neck:   Supple, symmetrical, trachea midline, no adenopathy;    thyroid:  no enlargement/tenderness/nodules; no carotid   bruit or JVD  Back:     Symmetric, no curvature, ROM normal, no CVA tenderness  Lungs:     Clear to auscultation bilaterally, respirations unlabored  Chest Wall:    No tenderness or deformity   Heart:    Regular rate and rhythm, S1 and S2 normal, no murmur, rub   or gallop  Breast Exam:    Deferred  Abdomen:     Soft, non-tender, bowel sounds active all four quadrants,    no masses, no organomegaly  Genitalia:    Normal female without lesion, discharge or tenderness  Rectal:    Normal tone no masses or tenderness  Extremities:   Extremities normal, atraumatic, no cyanosis or edema  Pulses:   2+ and symmetric all extremities  Skin:   Skin color, texture, turgor normal, no rashes or lesions  Lymph nodes:   Cervical, supraclavicular, and axillary nodes normal  Neurologic:   CNII-XII intact, normal strength, sensation and reflexes    throughout    Assessment/Plan:   Routine Physical Examination Today patient counseled on age appropriate routine health concerns for screening and prevention, each reviewed and up to date or declined. Immunizations reviewed and up to date or declined.  Labs ordered and reviewed. Risk factors for depression reviewed and negative. Hearing function and visual acuity are intact. ADLs screened and addressed as needed. Functional ability and level of safety reviewed and appropriate. Education, counseling and referrals performed based on assessed risks today. Patient provided with a copy of personalized plan for preventive services.  Pap smear  Performed today Declined STD testing  Hair Loss Update blood work Will maximize iron supplementation for ferritin goal > 75 as indicated on blood work Will send referral to dermatology per patient request   Patient Counseling:   [x]     Nutrition: Stressed importance of moderation in sodium/caffeine intake, saturated fat and cholesterol, caloric balance, sufficient intake of fresh fruits, vegetables, fiber, calcium, iron, and  1 mg of folate supplement per day (for females capable of pregnancy).   [x]      Stressed the importance of regular exercise.    [x]     Substance Abuse: Discussed cessation/primary prevention of tobacco, alcohol, or other drug use; driving or other dangerous activities under the influence; availability of treatment for abuse.    [x]      Injury prevention: Discussed safety belts, safety helmets, smoke detector, smoking near bedding or upholstery.    [x]      Sexuality: Discussed sexually transmitted diseases, partner selection, use of condoms, avoidance of unintended pregnancy  and contraceptive alternatives.    [x]     Dental health: Discussed importance of regular tooth brushing, flossing, and dental visits.   [x]      Health maintenance and immunizations reviewed. Please refer to Health maintenance section.   I,Havlyn C Ratchford,acting as a for , PA.,have documented all relevant documentation on the behalf of , PA,as directed by  , PA while in the presence of , .  I, Neurosurgeon, [employer], have reviewed all  documentation for this visit. The documentation on 12/08/20 for the exam, diagnosis, procedures, and orders are all accurate and complete.   Jarold Motto, PA-C [facility]

## 2020-12-08 NOTE — Patient Instructions (Signed)
It was great to see you! ? ?Please go to the lab for blood work.  ? ?Our office will call you with your results unless you have chosen to receive results via MyChart. ? ?If your blood work is normal we will follow-up each year for physicals and as scheduled for chronic medical problems. ? ?If anything is abnormal we will treat accordingly and get you in for a follow-up. ? ?Take care, ? ?Kelsy Polack ?  ? ? ?

## 2020-12-14 LAB — CYTOLOGY - PAP: Diagnosis: NEGATIVE

## 2021-08-03 ENCOUNTER — Other Ambulatory Visit: Payer: Self-pay | Admitting: Physician Assistant

## 2021-10-10 ENCOUNTER — Encounter: Payer: Self-pay | Admitting: *Deleted

## 2021-11-03 ENCOUNTER — Encounter: Payer: Self-pay | Admitting: Physician Assistant

## 2021-11-03 ENCOUNTER — Ambulatory Visit: Payer: BC Managed Care – PPO | Admitting: Sports Medicine

## 2021-11-03 ENCOUNTER — Ambulatory Visit (INDEPENDENT_AMBULATORY_CARE_PROVIDER_SITE_OTHER): Payer: BC Managed Care – PPO | Admitting: Physician Assistant

## 2021-11-03 VITALS — BP 110/70 | HR 85 | Ht 65.0 in | Wt 131.0 lb

## 2021-11-03 VITALS — BP 110/70 | HR 80 | Temp 97.5°F | Ht 65.0 in | Wt 130.4 lb

## 2021-11-03 DIAGNOSIS — M542 Cervicalgia: Secondary | ICD-10-CM

## 2021-11-03 DIAGNOSIS — S46812A Strain of other muscles, fascia and tendons at shoulder and upper arm level, left arm, initial encounter: Secondary | ICD-10-CM

## 2021-11-03 DIAGNOSIS — Z23 Encounter for immunization: Secondary | ICD-10-CM

## 2021-11-03 DIAGNOSIS — F419 Anxiety disorder, unspecified: Secondary | ICD-10-CM | POA: Diagnosis not present

## 2021-11-03 DIAGNOSIS — S46811A Strain of other muscles, fascia and tendons at shoulder and upper arm level, right arm, initial encounter: Secondary | ICD-10-CM

## 2021-11-03 MED ORDER — METHOCARBAMOL 500 MG PO TABS: 500.0000 mg | ORAL_TABLET | Freq: Every evening | ORAL | 0 refills | Status: DC | PRN

## 2021-11-03 MED ORDER — HYDROXYZINE HCL 25 MG PO TABS: 25.0000 mg | ORAL_TABLET | Freq: Every day | ORAL | 0 refills | Status: DC

## 2021-11-03 MED ORDER — MELOXICAM 15 MG PO TABS: 15.0000 mg | ORAL_TABLET | Freq: Every day | ORAL | 0 refills | Status: DC

## 2021-11-03 NOTE — Progress Notes (Signed)
Katrina Wood is a 26 y.o. female here for a follow up of a pre-existing problem.  History of Present Illness:   Chief Complaint  Patient presents with   Neck Pain    Pt is still c/o neck and shoulder pain and it is getting worse, past 6 months. She takes Ibuprofen as needed. Also having tingling in arms and legs off and on.   Anxiety    Neck pain: She complains of neck and muscle pain. She was here few years ago with the same issue, we did do an xray in 2020 that was normal. 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 She also endorses significant anxiety at night and feels like she is tense from that as well.  Ortho referral was placed in 2021 in [place] but she never got a call and didn't go.  Anxiety: She mentions that she used to go to counseling due to family trauma, but stopped few years ago. She is requesting anxiety medication due to school stress. She wants to take it prn at night.   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   History reviewed. No pertinent past medical history.   Social History   Tobacco Use   Smoking status: Never   Smokeless tobacco: Never  Vaping Use   Vaping Use: Never used  Substance Use Topics   Alcohol use: Yes    Comment: 4 drinks every other weekend   Drug use: No    History reviewed. No pertinent surgical history.  Family History  Problem Relation Age of Onset   Hyperlipidemia Mother    Hypertension Mother    Asthma Sister    Hyperlipidemia Maternal Grandfather    Hypertension Maternal Grandfather    Heart attack Maternal Grandfather    Stroke Paternal Grandfather    Cancer Neg Hx     No Known Allergies  Current Medications:   Current Outpatient Medications:    BIOTIN  PO, Take 1 tablet by mouth daily., Disp: , Rfl:    cetirizine (ZYRTEC) 10 MG tablet, Take 10 mg by mouth daily., Disp: , Rfl:    ferrous sulfate 325 (65 FE) MG EC tablet, Take 325 mg by mouth once a week., Disp: , Rfl:    hydrOXYzine (ATARAX) 25 MG tablet, Take 1 tablet (25 mg total) by mouth at bedtime., Disp: 30 tablet, Rfl: 0   JUNEL FE 1/20 1-20 MG-MCG tablet, TAKE 1 TABLET BY MOUTH EVERY DAY, Disp: 84 tablet, Rfl: 1   Multiple Vitamin (MULTIVITAMIN) tablet, Take 1 tablet by mouth daily., Disp: , Rfl:    Review of Systems:   Review of Systems  Musculoskeletal:  Positive for neck pain.       (+) Generalized muscular pain   Psychiatric/Behavioral:  The patient is nervous/anxious.     Vitals:   Vitals:   11/03/21 1035  BP: 110/70  Pulse: 80  Temp: (!) 97.5 F (36.4 C)  TempSrc: Temporal  SpO2: 100%  Weight: 130 lb 6.1 oz (59.1 kg)  Height: 5\' 5"  (1.651 m)     Body mass index is 21.7 kg/m.  Physical Exam:   Physical Exam Constitutional:      General: She is not in acute distress.    Appearance: Normal  appearance. She is not ill-appearing.  HENT:     Head: Normocephalic and atraumatic.     Right Ear: External ear normal.     Left Ear: External ear normal.  Eyes:     Extraocular Movements: Extraocular movements intact.     Pupils: Pupils are equal, round, and reactive to light.  Cardiovascular:     Rate and Rhythm: Normal rate and regular rhythm.  Skin:    General: Skin is warm and dry.  Neurological:     Mental Status: She is alert and oriented to person, place, and time.  Psychiatric:        Judgment: Judgment normal.     Assessment and Plan:   Cervical pain Referral to sports medicine  Anxiety Uncontrolled Trial 12.5 mg hydroxyzine for this, may increase by 12.5 mg as tolerated up to 50 mg nightly She declines any sort of SSRI at this time Follow-up if lack of improvement Consider counseling  Need for immunization against influenza Completed  today  I,Param Shah,acting as a scribe for [employer], PA.,have documented all relevant documentation on the behalf of Jarold Motto, PA,as directed by  Jarold Motto, PA while in the presence of Jarold Motto, [place].  I, Jarold Motto, [place], have reviewed all documentation for this visit. The documentation on 11/03/21 for the exam, diagnosis, procedures, and orders are all accurate and complete.  Jarold Motto, PA-C

## 2021-11-03 NOTE — Patient Instructions (Addendum)
Good to see you  Neck trap scap  HEP  - Start meloxicam 15 mg daily x2 weeks.  If still having pain after 2 weeks, complete 3rd-week of meloxicam. May use remaining meloxicam as needed once daily for pain control.  Do not to use additional NSAIDs while taking meloxicam.  May use Tylenol [phone] mg 2 to 3 times a day for breakthrough pain. Recommend vitamin D 400-600 IU daily  Robaxin 500 mg nightly as needed for muscle spasms 6 week follow up

## 2021-11-03 NOTE — Patient Instructions (Addendum)
It was great to see you!  Trial the 12.5 mg (1/2 tablet) hydroxyzine for sleep, may increase to 25 mg or even 50 mg if needed Message me if this is NOT helpful  Go see Dr Glennon Mac this afternoon for your neck at 3p!  Take care,  Inda Coke PA-C

## 2021-11-03 NOTE — Progress Notes (Signed)
[name] Phone: [phone]   Assessment and Plan:     1. Neck pain 2. Strain of left trapezius muscle, initial encounter 3. 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  Patient is starting hydroxyzine with PCP - Start meloxicam 15 mg daily x2 weeks.  If still having pain after 2 weeks, complete 3rd-week of meloxicam. May use remaining meloxicam as needed once daily for pain control.  Do not to use additional NSAIDs while taking meloxicam.  May use Tylenol [phone] mg 2 to 3 times a day for breakthrough pain. - Start Robaxin 500 mg nightly as needed for muscle spasms - Start HEP for neck, trapezius, scapula - Start vitamin D 400 to 600 IU daily.  Patient already taking vitamin B12 and magnesium -Sees x-ray from 06/28/2018 showed no significant cervical findings.  Did have relative cervical straightening.  Without red flag symptoms or new trauma, x-ray was not repeated today -Patient did not receive benefit with physical therapy or dry needling in the past.  Pertinent previous records reviewed include family medicine note 11/03/2021, C-spine x-ray 06/28/2018   Follow Up: 6 weeks for reevaluation.  If no improvement or worsening of symptoms, could consider OMT versus advanced imaging with MRI.     Subjective:   I, Katrina Wood, am serving as a Education administrator for Doctor Katrina Wood  Chief Complaint: neck pain   HPI:   11/03/21 Patient is a 26 year old female complaining of neck pain. Patient states neck and muscle pain. She was here few years ago with the same issue, we  did do an xray in 2020 that was normal. 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 She also endorses significant anxiety at night and feels like she is tense from that as well. Tylenol, ib and tiger balm for the pain but doesn't last ,    Ortho referral was placed in 2021 in [place] but she never got a call and didn't go.  Relevant Historical Information: Anxiety   Additional pertinent review of systems negative.   Current Outpatient Medications:    BIOTIN PO, Take 1 tablet by mouth daily., Disp: , Rfl:    cetirizine (ZYRTEC) 10 MG tablet, Take 10 mg by mouth daily., Disp: , Rfl:    ferrous sulfate 325 (65 FE) MG EC tablet, Take 325 mg by mouth once a week., Disp: , Rfl:    hydrOXYzine (ATARAX) 25 MG tablet, Take 1 tablet (25 mg total) by mouth at bedtime., Disp: 30 tablet, Rfl: 0   JUNEL FE 1/20 1-20 MG-MCG tablet, TAKE 1 TABLET BY MOUTH EVERY DAY, Disp: 84 tablet, Rfl: 1   meloxicam (MOBIC) 15 MG tablet, Take 1 tablet (15 mg total) by mouth daily., Disp: 30 tablet, Rfl: 0   methocarbamol (ROBAXIN) 500 MG tablet, Take 1 tablet (500 mg total) by mouth at bedtime as needed for muscle spasms., Disp: 30 tablet, Rfl: 0   Multiple Vitamin (MULTIVITAMIN) tablet, Take 1  tablet by mouth daily., Disp: , Rfl:    Objective:     Vitals:   11/03/21 1456  BP: 110/70  Pulse: 85  SpO2: 100%  Weight: 131 lb (59.4 kg)  Height: 5\' 5"  (1.651 m)      Body mass index is 21.8 kg/m.    Physical Exam:    Cervical Spine: Posture normal Skin: normal, intact  Neurological:   Strength:  Right  Left   Deltoid 5/5 5/5  Bicep 5/5  5/5  Tricep 5/5 5/5  Wrist Flexion 5/5 5/5  Wrist Extension 5/5 5/5  Grip 5/5 5/5  Finger Abduction 5/5 5/5   Sensation: intact to light touch in upper  extremities bilaterally  Spurling's:  negative bilaterally Neck ROM: Decreased active ROM in right rotation and bilateral sidebending TTP:  cervical paraspinal, thoracic paraspinal, trapezius, rhomboids, levator NTTP: cervical spinous processes,   Electronically signed by:  D[facility] 3:36 PM 11/03/21

## 2021-11-30 ENCOUNTER — Other Ambulatory Visit: Payer: Self-pay | Admitting: Physician Assistant

## 2021-12-10 ENCOUNTER — Other Ambulatory Visit: Payer: Self-pay | Admitting: Sports Medicine

## 2021-12-26 ENCOUNTER — Encounter: Payer: BC Managed Care – PPO | Admitting: Physician Assistant

## 2021-12-26 NOTE — Progress Notes (Unsigned)
   Katrina Wood D.[facility] [address] [place] [address] Phone: [phone]   Assessment and Plan:     There are no diagnoses linked to this encounter.  *** - Patient has received significant relief with OMT in the past.  Elects for repeat OMT today.  Tolerated well per note below. - Decision today to treat with OMT was based on Physical Exam   After verbal consent patient was treated with HVLA (high velocity low amplitude), ME (muscle energy), FPR (flex positional release), ST (soft tissue), PC/PD (Pelvic Compression/ Pelvic Decompression) techniques in cervical, rib, thoracic, lumbar, and pelvic areas. Patient tolerated the procedure well with improvement in symptoms.  Patient educated on potential side effects of soreness and recommended to rest, hydrate, and use Tylenol as needed for pain control.   Pertinent previous records reviewed include ***   Follow Up: ***     Subjective:   I, Katrina Wood, am serving as a Neurosurgeon for Doctor Richardean Sale   Chief Complaint: neck pain    HPI:    11/03/21 Patient is a 26 year old female complaining of neck pain. Patient states neck and muscle pain. She was here few years ago with the same issue, we did do an xray in 2020 that was normal. 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 She also endorses significant anxiety at night and feels like she is tense from that as well. Tylenol, ib and tiger balm for the pain but doesn't last ,    Ortho referral was placed in 2021 in [place] but she never got a call and didn't go.  12/27/2021 Patient states    Relevant Historical Information: Anxiety Additional pertinent review of systems negative.  Current Outpatient Medications  Medication Sig  Dispense Refill   BIOTIN PO Take 1 tablet by mouth daily.     cetirizine (ZYRTEC) 10 MG tablet Take 10 mg by mouth daily.     ferrous sulfate 325 (65 FE) MG EC tablet Take 325 mg by mouth once a week.     hydrOXYzine (ATARAX) 25 MG tablet TAKE 1 TABLET BY MOUTH EVERYDAY AT BEDTIME 30 tablet 0   JUNEL FE 1/20 1-20 MG-MCG tablet TAKE 1 TABLET BY MOUTH EVERY DAY 84 tablet 1   meloxicam (MOBIC) 15 MG tablet Take 1 tablet (15 mg total) by mouth daily. 30 tablet 0   methocarbamol (ROBAXIN) 500 MG tablet Take 1 tablet (500 mg total) by mouth at bedtime as needed for muscle spasms. 30 tablet 0   Multiple Vitamin (MULTIVITAMIN) tablet Take 1 tablet by mouth daily.     No current facility-administered medications for this visit.      Objective:     There were no vitals filed for this visit.    There is no height or weight on file to calculate BMI.    Physical Exam:     General: Well-appearing, cooperative, sitting comfortably in no acute distress.   OMT Physical Exam:  ASIS Compression Test: Positive Right Cervical: TTP paraspinal, *** Rib: Bilateral elevated first rib with TTP Thoracic: TTP paraspinal,*** Lumbar: TTP paraspinal,*** Pelvis: Right anterior innominate  Electronically signed by:  Katrina Wood D.[facility] 12:01 PM 12/26/21

## 2021-12-27 ENCOUNTER — Ambulatory Visit: Payer: BC Managed Care – PPO | Admitting: Sports Medicine

## 2021-12-27 VITALS — BP 110/82 | HR 79 | Ht 65.0 in | Wt 135.0 lb

## 2021-12-27 DIAGNOSIS — M542 Cervicalgia: Secondary | ICD-10-CM | POA: Diagnosis not present

## 2021-12-27 DIAGNOSIS — S46812D Strain of other muscles, fascia and tendons at shoulder and upper arm level, left arm, subsequent encounter: Secondary | ICD-10-CM | POA: Diagnosis not present

## 2021-12-27 DIAGNOSIS — S46811D Strain of other muscles, fascia and tendons at shoulder and upper arm level, right arm, subsequent encounter: Secondary | ICD-10-CM

## 2021-12-27 NOTE — Patient Instructions (Addendum)
Good to see you  Cervical mri  Follow up 3 days after to discuss results

## 2021-12-30 ENCOUNTER — Encounter: Payer: Self-pay | Admitting: Physician Assistant

## 2021-12-30 ENCOUNTER — Ambulatory Visit (INDEPENDENT_AMBULATORY_CARE_PROVIDER_SITE_OTHER): Payer: BC Managed Care – PPO | Admitting: Physician Assistant

## 2021-12-30 VITALS — BP 100/60 | HR 70 | Temp 98.0°F | Ht 65.0 in | Wt 132.4 lb

## 2021-12-30 DIAGNOSIS — F419 Anxiety disorder, unspecified: Secondary | ICD-10-CM

## 2021-12-30 DIAGNOSIS — E559 Vitamin D deficiency, unspecified: Secondary | ICD-10-CM

## 2021-12-30 DIAGNOSIS — Z1159 Encounter for screening for other viral diseases: Secondary | ICD-10-CM

## 2021-12-30 DIAGNOSIS — Z13220 Encounter for screening for lipoid disorders: Secondary | ICD-10-CM

## 2021-12-30 DIAGNOSIS — E538 Deficiency of other specified B group vitamins: Secondary | ICD-10-CM

## 2021-12-30 DIAGNOSIS — Z136 Encounter for screening for cardiovascular disorders: Secondary | ICD-10-CM

## 2021-12-30 DIAGNOSIS — Z0000 Encounter for general adult medical examination without abnormal findings: Secondary | ICD-10-CM | POA: Diagnosis not present

## 2021-12-30 MED ORDER — AMITRIPTYLINE HCL 25 MG PO TABS: 25.0000 mg | ORAL_TABLET | Freq: Every day | ORAL | 1 refills | Status: DC

## 2021-12-30 NOTE — Patient Instructions (Addendum)
It was great to see you!  Start amitriptyline 25 mg daily Follow-up in 1-3 months to check in on this Tell someone you trust that you are taking this medication If you develop suicidal thoughts, please tell someone and immediately proceed to our local 24/7 crisis center, [facility] [address], [place], [place] [address] [phone].  Please go to the lab for blood work.   Our office will call you with your results unless you have chosen to receive results via MyChart.  If your blood work is normal we will follow-up each year for physicals and as scheduled for chronic medical problems.  If anything is abnormal we will treat accordingly and get you in for a follow-up.  Take care,  Lelon Mast

## 2021-12-30 NOTE — Progress Notes (Signed)
Subjective:    Katrina Wood is a 26 y.o. female and is here for a comprehensive physical exam.  HPI  Health Maintenance Due  Topic Date Due   Hepatitis C Screening  Never done   CHLAMYDIA SCREENING  12/13/2018   COVID-19 Vaccine (5 - 2023-24 season) 09/16/2021    Acute Concerns: Thyroid Patient is requesting having her thyroid levels checked today. She has a strong fmhx of hyperthyroidism.  Chronic Issues: Anxiety Patient reports that she stopped taking hydroxyzine medication because it made her feel dizzy. She reports that after 3 years, she has stopped going to counseling because she feels as though she is able to use the tools she learned outside of her appointments.  Neck pain Patient is complaining of neck pain. She states that she saw her sports medicine specialist, Dr. Jean Rosenthal and who gave her medication that did not help. She reports that she has an upcoming appointment at sports medicine in 3 days for a MRI for this issue. She is requesting a referral to an acupuncturist.  Iron deficiency  Patient reports that she is complaint with her iron supplements once a week.  Vitamin B-12 deficiency Patient reports that she is complaint with her b-12 tablets.  Vitamin D deficiency  She is complaint with her 1000 U Vitamin D supplements.  Health Maintenance: PAP -- last completed on 12/08/2020. Diet -- She maintains a fair diet. Exercise -- She participates in regular exercise 3-4 times a week.  UTD with dentist? - She is UTD on dental care. UTD with eye doctor? - She is not UTD on vision care.  Weight history: Wt Readings from Last 10 Encounters:  12/27/21 135 lb (61.2 kg)  11/03/21 131 lb (59.4 kg)  11/03/21 130 lb 6.1 oz (59.1 kg)  12/08/20 136 lb 4 oz (61.8 kg)  04/12/20 141 lb 6.4 oz (64.1 kg)  08/19/19 143 lb 9.6 oz (65.1 kg)  08/06/19 145 lb (65.8 kg)  11/01/18 143 lb (64.9 kg)  06/28/18 142 lb 4 oz (64.5 kg)  04/01/18 140 lb (63.5 kg)   There is no  height or weight on file to calculate BMI. No LMP recorded.  Alcohol use:  reports current alcohol use.  Tobacco use:  Tobacco Use: Low Risk  (11/03/2021)   Patient History    Smoking Tobacco Use: Never    Smokeless Tobacco Use: Never    Passive Exposure: Not on file   Eligible for lung cancer screening? no     12/08/2020   11:20 AM  Depression screen PHQ 2/9  Decreased Interest 0  Down, Depressed, Hopeless 0  PHQ - 2 Score 0     Other providers/specialists: Patient Care Team: Jarold Motto, [place] as PCP - General (Physician Assistant)    PMHx, SurgHx, SocialHx, Medications, and Allergies were reviewed in the Visit Navigator and updated as appropriate.   No past medical history on file.  No past surgical history on file.   Family History  Problem Relation Age of Onset   Hyperlipidemia Mother    Hypertension Mother    Asthma Sister    Hyperlipidemia Maternal Grandfather    Hypertension Maternal Grandfather    Heart attack Maternal Grandfather    Stroke Paternal Grandfather    Cancer Neg Hx     Social History   Tobacco Use   Smoking status: Never   Smokeless tobacco: Never  Vaping Use   Vaping Use: Never used  Substance Use Topics   Alcohol use: Yes  Comment: 4 drinks every other weekend   Drug use: No    Review of Systems:   Review of Systems  Constitutional:  Negative for chills, fever, malaise/fatigue and weight loss.  HENT:  Negative for hearing loss, sinus pain and sore throat.   Respiratory:  Negative for cough and hemoptysis.   Cardiovascular:  Negative for chest pain, palpitations, leg swelling and PND.  Gastrointestinal:  Negative for abdominal pain, constipation, diarrhea, heartburn, nausea and vomiting.  Genitourinary:  Negative for dysuria, frequency and urgency.  Musculoskeletal:  Positive for neck pain. Negative for back pain and myalgias.  Skin:  Negative for itching and rash.  Endo/Heme/Allergies:  Negative for polydipsia.   Psychiatric/Behavioral:  Negative for depression. The patient is nervous/anxious.     Objective:   There were no vitals taken for this visit. There is no height or weight on file to calculate BMI.   General Appearance:    Alert, cooperative, no distress, appears stated age  Head:    Normocephalic, without obvious abnormality, atraumatic  Eyes:    PERRL, conjunctiva/corneas clear, EOM's intact, fundi    benign, both eyes  Ears:    Normal TM's and external ear canals, both ears  Nose:   Nares normal, septum midline, mucosa normal, no drainage    or sinus tenderness  Throat:   Lips, mucosa, and tongue normal; teeth and gums normal  Neck:   Supple, symmetrical, trachea midline, no adenopathy;    thyroid:  no enlargement/tenderness/nodules; no carotid   bruit or JVD  Back:     Symmetric, no curvature, ROM normal, no CVA tenderness  Lungs:     Clear to auscultation bilaterally, respirations unlabored  Chest Wall:    No tenderness or deformity   Heart:    Regular rate and rhythm, S1 and S2 normal, no murmur, rub or gallop  Breast Exam:    Deferred  Abdomen:     Soft, non-tender, bowel sounds active all four quadrants,    no masses, no organomegaly  Genitalia:    Deferred  Extremities:   Extremities normal, atraumatic, no cyanosis or edema  Pulses:   2+ and symmetric all extremities  Skin:   Skin color, texture, turgor normal, no rashes or lesions  Lymph nodes:   Cervical, supraclavicular, and axillary nodes normal  Neurologic:   CNII-XII intact, normal strength, sensation and reflexes    throughout    Assessment/Plan:   Routine physical examination Today patient counseled on age appropriate routine health concerns for screening and prevention, each reviewed and up to date or declined. Immunizations reviewed and up to date or declined. Labs ordered and reviewed. Risk factors for depression reviewed and negative. Hearing function and visual acuity are intact. ADLs screened and  addressed as needed. Functional ability and level of safety reviewed and appropriate. Education, counseling and referrals performed based on assessed risks today. Patient provided with a copy of personalized plan for preventive services.  Anxiety Uncontrolled Denies SI/HI Trial 25 mg elavil Follow-up in 1-3 months, sooner if concerns Risks/benefits discussed I discussed with patient that if they develop any SI, to tell someone immediately and seek medical attention.  Encounter for lipid screening for cardiovascular disease Update lipid panel  B12 deficiency Update B12 and make recommendations accordingly  Vitamin D deficiency Update Vit D and make recommendations accordingly  Encounter for screening for other viral diseases Update Hep C  I,Verona Buck,acting as a scribe for [employer], PA.,have documented all relevant documentation on the behalf of Lelon Mast  Bufford Buttner, PA,as directed by  Jarold Motto, PA while in the presence of [place], [place].  I, [place], [place], have reviewed all documentation for this visit. The documentation on 12/30/21 for the exam, diagnosis, procedures, and orders are all accurate and complete.  Jarold Motto, PA-C [facility]

## 2022-01-01 ENCOUNTER — Ambulatory Visit (INDEPENDENT_AMBULATORY_CARE_PROVIDER_SITE_OTHER): Payer: BC Managed Care – PPO

## 2022-01-01 DIAGNOSIS — M542 Cervicalgia: Secondary | ICD-10-CM | POA: Diagnosis not present

## 2022-01-01 DIAGNOSIS — S46811D Strain of other muscles, fascia and tendons at shoulder and upper arm level, right arm, subsequent encounter: Secondary | ICD-10-CM

## 2022-01-01 DIAGNOSIS — S46812D Strain of other muscles, fascia and tendons at shoulder and upper arm level, left arm, subsequent encounter: Secondary | ICD-10-CM

## 2022-01-02 LAB — CBC WITH DIFFERENTIAL/PLATELET
Absolute Monocytes: 490 cells/uL (ref 200–950)
Basophils Absolute: 29 cells/uL (ref 0–200)
Basophils Relative: 0.4 %
Eosinophils Absolute: 72 cells/uL (ref 15–500)
Eosinophils Relative: 1 %
HCT: 36.5 % (ref 35.0–45.0)
Hemoglobin: 12.2 g/dL (ref 11.7–15.5)
Lymphs Abs: 3046 cells/uL (ref 850–3900)
MCH: 27.4 pg (ref 27.0–33.0)
MCHC: 33.4 g/dL (ref 32.0–36.0)
MCV: 81.8 fL (ref 80.0–100.0)
MPV: 9.4 fL (ref 7.5–12.5)
Monocytes Relative: 6.8 %
Neutro Abs: 3564 cells/uL (ref 1500–7800)
Neutrophils Relative %: 49.5 %
Platelets: 322 10*3/uL (ref 140–400)
RBC: 4.46 10*6/uL (ref 3.80–5.10)
RDW: 12.4 % (ref 11.0–15.0)
Total Lymphocyte: 42.3 %
WBC: 7.2 10*3/uL (ref 3.8–10.8)

## 2022-01-02 LAB — COMPREHENSIVE METABOLIC PANEL
AG Ratio: 1.5 (calc) (ref 1.0–2.5)
ALT: 12 U/L (ref 6–29)
AST: 17 U/L (ref 10–30)
Albumin: 4.1 g/dL (ref 3.6–5.1)
Alkaline phosphatase (APISO): 49 U/L (ref 31–125)
BUN: 12 mg/dL (ref 7–25)
CO2: 27 mmol/L (ref 20–32)
Calcium: 9.3 mg/dL (ref 8.6–10.2)
Chloride: 103 mmol/L (ref 98–110)
Creat: 0.7 mg/dL (ref 0.50–0.96)
Globulin: 2.8 g/dL (calc) (ref 1.9–3.7)
Glucose, Bld: 93 mg/dL (ref 65–99)
Potassium: 4.3 mmol/L (ref 3.5–5.3)
Sodium: 136 mmol/L (ref 135–146)
Total Bilirubin: 0.4 mg/dL (ref 0.2–1.2)
Total Protein: 6.9 g/dL (ref 6.1–8.1)

## 2022-01-02 LAB — IRON,TIBC AND FERRITIN PANEL
%SAT: 40 % (calc) (ref 16–45)
Ferritin: 14 ng/mL — ABNORMAL LOW (ref 16–154)
Iron: 192 ug/dL — ABNORMAL HIGH (ref 40–190)
TIBC: 476 mcg/dL (calc) — ABNORMAL HIGH (ref 250–450)

## 2022-01-02 LAB — TSH: TSH: 1.91 mIU/L

## 2022-01-02 LAB — HEPATITIS C ANTIBODY: Hepatitis C Ab: NONREACTIVE

## 2022-01-02 LAB — VITAMIN D 25 HYDROXY (VIT D DEFICIENCY, FRACTURES): Vit D, 25-Hydroxy: 44 ng/mL (ref 30–100)

## 2022-01-02 LAB — LIPID PANEL
Cholesterol: 168 mg/dL (ref <200)
HDL: 53 mg/dL (ref >=50)
LDL Cholesterol (Calc): 87 mg/dL (calc)
Non-HDL Cholesterol (Calc): 115 mg/dL (calc) (ref <130)
Total CHOL/HDL Ratio: 3.2 (calc) (ref <5.0)
Triglycerides: 179 mg/dL — ABNORMAL HIGH (ref <150)

## 2022-01-02 LAB — VITAMIN B12: Vitamin B-12: 598 pg/mL (ref 200–1100)

## 2022-01-18 ENCOUNTER — Other Ambulatory Visit: Payer: Self-pay | Admitting: Physician Assistant

## 2022-03-31 ENCOUNTER — Telehealth: Payer: Self-pay | Admitting: Physician Assistant

## 2022-03-31 MED ORDER — AMITRIPTYLINE HCL 25 MG PO TABS: 25.0000 mg | ORAL_TABLET | Freq: Every day | ORAL | 2 refills | Status: DC

## 2022-03-31 NOTE — Telephone Encounter (Signed)
Rx sent to pharmacy   

## 2022-03-31 NOTE — Telephone Encounter (Signed)
  LAST APPOINTMENT DATE:  12/30/21  NEXT APPOINTMENT DATE:  MEDICATION:  amitriptyline (ELAVIL) 25 MG tablet  Is the patient out of medication? 2 tablets left  PHARMACY: [id number] - [place], [place] - [address] Phone: [phone]  Fax: [phone]

## 2022-04-24 ENCOUNTER — Other Ambulatory Visit: Payer: Self-pay | Admitting: Physician Assistant

## 2022-07-07 ENCOUNTER — Other Ambulatory Visit: Payer: Self-pay | Admitting: Physician Assistant

## 2022-10-14 ENCOUNTER — Other Ambulatory Visit: Payer: Self-pay | Admitting: Physician Assistant

## 2022-12-21 ENCOUNTER — Other Ambulatory Visit: Payer: Self-pay | Admitting: Physician Assistant

## 2023-01-11 ENCOUNTER — Encounter: Payer: BC Managed Care – PPO | Admitting: Physician Assistant

## 2023-01-12 NOTE — Telephone Encounter (Signed)
Copied from CRM [id number]. Topic: General - Call Back - No Documentation >> Jan 12, 2023  4:05 PM Truddie Crumble wrote: Reason for CRM: Pt returned call stating someone call her to reschedule her physical appointment. Pt stated she lives in [place] now and she was just here for Christmas. Pt wanted to get her cpe done while she was here. Pt stated she will find a closer PCP because she does not live here anymore    FYI

## 2023-01-13 ENCOUNTER — Other Ambulatory Visit: Payer: Self-pay | Admitting: Physician Assistant

## 2023-01-15 ENCOUNTER — Encounter: Payer: BC Managed Care – PPO | Admitting: Family

## 2023-03-26 ENCOUNTER — Ambulatory Visit (INDEPENDENT_AMBULATORY_CARE_PROVIDER_SITE_OTHER): Payer: Self-pay | Admitting: Physician Assistant

## 2023-03-26 ENCOUNTER — Encounter: Payer: Self-pay | Admitting: Physician Assistant

## 2023-03-26 VITALS — BP 110/80 | HR 77 | Temp 98.2°F | Ht 65.0 in | Wt 139.5 lb

## 2023-03-26 DIAGNOSIS — E559 Vitamin D deficiency, unspecified: Secondary | ICD-10-CM

## 2023-03-26 DIAGNOSIS — Z0000 Encounter for general adult medical examination without abnormal findings: Secondary | ICD-10-CM | POA: Diagnosis not present

## 2023-03-26 DIAGNOSIS — Z842 Family history of other diseases of the genitourinary system: Secondary | ICD-10-CM

## 2023-03-26 DIAGNOSIS — D509 Iron deficiency anemia, unspecified: Secondary | ICD-10-CM

## 2023-03-26 DIAGNOSIS — Z13220 Encounter for screening for lipoid disorders: Secondary | ICD-10-CM | POA: Diagnosis not present

## 2023-03-26 DIAGNOSIS — Z136 Encounter for screening for cardiovascular disorders: Secondary | ICD-10-CM

## 2023-03-26 DIAGNOSIS — F419 Anxiety disorder, unspecified: Secondary | ICD-10-CM

## 2023-03-26 LAB — CBC WITH DIFFERENTIAL/PLATELET
Basophils Absolute: 0 10*3/uL (ref 0.0–0.1)
Basophils Relative: 0.5 % (ref 0.0–3.0)
Eosinophils Absolute: 0.1 10*3/uL (ref 0.0–0.7)
Eosinophils Relative: 1.9 % (ref 0.0–5.0)
HCT: 37.6 % (ref 36.0–46.0)
Hemoglobin: 12.3 g/dL (ref 12.0–15.0)
Lymphocytes Relative: 42 % (ref 12.0–46.0)
Lymphs Abs: 2.8 10*3/uL (ref 0.7–4.0)
MCHC: 32.7 g/dL (ref 30.0–36.0)
MCV: 79.5 fl (ref 78.0–100.0)
Monocytes Absolute: 0.5 10*3/uL (ref 0.1–1.0)
Monocytes Relative: 7.5 % (ref 3.0–12.0)
Neutro Abs: 3.2 10*3/uL (ref 1.4–7.7)
Neutrophils Relative %: 48.1 % (ref 43.0–77.0)
Platelets: 356 10*3/uL (ref 150.0–400.0)
RBC: 4.73 Mil/uL (ref 3.87–5.11)
RDW: 13.7 % (ref 11.5–15.5)
WBC: 6.6 10*3/uL (ref 4.0–10.5)

## 2023-03-26 LAB — LIPID PANEL
Cholesterol: 168 mg/dL (ref 0–200)
HDL: 56.9 mg/dL (ref >39.00)
LDL Cholesterol: 82 mg/dL (ref 0–99)
NonHDL: 111.31
Total CHOL/HDL Ratio: 3
Triglycerides: 147 mg/dL (ref 0.0–149.0)
VLDL: 29.4 mg/dL (ref 0.0–40.0)

## 2023-03-26 LAB — COMPREHENSIVE METABOLIC PANEL
ALT: 14 U/L (ref 0–35)
AST: 18 U/L (ref 0–37)
Albumin: 4.3 g/dL (ref 3.5–5.2)
Alkaline Phosphatase: 68 U/L (ref 39–117)
BUN: 10 mg/dL (ref 6–23)
CO2: 28 meq/L (ref 19–32)
Calcium: 9.7 mg/dL (ref 8.4–10.5)
Chloride: 101 meq/L (ref 96–112)
Creatinine, Ser: 0.68 mg/dL (ref 0.40–1.20)
GFR: 121.17 mL/min (ref >60.00)
Glucose, Bld: 81 mg/dL (ref 70–99)
Potassium: 4.2 meq/L (ref 3.5–5.1)
Sodium: 137 meq/L (ref 135–145)
Total Bilirubin: 0.4 mg/dL (ref 0.2–1.2)
Total Protein: 7.1 g/dL (ref 6.0–8.3)

## 2023-03-26 LAB — IBC + FERRITIN
Ferritin: 13.4 ng/mL (ref 10.0–291.0)
Iron: 197 ug/dL — ABNORMAL HIGH (ref 42–145)
Saturation Ratios: 37.3 % (ref 20.0–50.0)
TIBC: 527.8 ug/dL — ABNORMAL HIGH (ref 250.0–450.0)
Transferrin: 377 mg/dL — ABNORMAL HIGH (ref 212.0–360.0)

## 2023-03-26 LAB — TSH: TSH: 1.99 u[IU]/mL (ref 0.35–5.50)

## 2023-03-26 LAB — VITAMIN D 25 HYDROXY (VIT D DEFICIENCY, FRACTURES): VITD: 33.1 ng/mL (ref 30.00–100.00)

## 2023-03-26 NOTE — Progress Notes (Signed)
 Subjective:    Katrina Wood is a 28 y.o. female and is here for a comprehensive physical exam.  HPI  Chief Complaint  Patient presents with   Annual Exam    Pt wants to discuss PCOS, mother has it.   Acute Concerns: Possible FMHx of PCOS Pt reports her mother has told her she has possibly been diagnosed with PCOS. She endorses excessive hair growth on her chin. Was previously on Junel, currently on Aurovela OCP.  She had a hx of irregular periods prior to starting OCPs.  Also has a strong FMHx of hypothyroidism.   Chronic Issues: Anxiety Pt is on Amitriptyline 25 mg once daily.  Tolerating well with no side effects reported.  Sleeping well with this dose.  No acute concerns.    Health Maintenance: Immunizations -- UTD Colonoscopy -- N/A Mammogram -- N/A PAP -- UTD, last done 12/14/2020. Results were normal. Next due 12/09/2023.  Bone Density -- N/A Diet -- No restrictive diets.  Exercise -- Unknown.   Sleep habits -- Good sleep quality. No acute concerns today.  Mood -- Stable currently. See above.   UTD with dentist? - yes. Pt is in dental school.  UTD with eye doctor? - No. Last seen over a year ago.   Weight history: Wt Readings from Last 10 Encounters:  03/26/23 139 lb 8 oz (63.3 kg)  12/30/21 132 lb 6.1 oz (60 kg)  12/27/21 135 lb (61.2 kg)  11/03/21 131 lb (59.4 kg)  11/03/21 130 lb 6.1 oz (59.1 kg)  12/08/20 136 lb 4 oz (61.8 kg)  04/12/20 141 lb 6.4 oz (64.1 kg)  08/19/19 143 lb 9.6 oz (65.1 kg)  08/06/19 145 lb (65.8 kg)  11/01/18 143 lb (64.9 kg)   Body mass index is 23.21 kg/m. Patient's last menstrual period was 03/13/2023 (approximate).  Alcohol use:  reports current alcohol use.  Tobacco use:  Tobacco Use: Low Risk  (03/26/2023)   Patient History    Smoking Tobacco Use: Never    Smokeless Tobacco Use: Never    Passive Exposure: Not on file   Eligible for lung cancer screening? no     03/26/2023    1:12 PM  Depression screen  PHQ 2/9  Decreased Interest 0  Down, Depressed, Hopeless 0  PHQ - 2 Score 0     Other providers/specialists: Patient Care Team: Jarold Motto, [place] as PCP - General (Physician Assistant)    PMHx, SurgHx, SocialHx, Medications, and Allergies were reviewed in the Visit Navigator and updated as appropriate.   No past medical history on file.   Past Surgical History:  Procedure Laterality Date   WISDOM TOOTH EXTRACTION       Family History  Problem Relation Age of Onset   Hyperlipidemia Mother    Hypertension Mother    Hypothyroidism Mother    Asthma Sister    Hypothyroidism Maternal Grandmother    Hyperlipidemia Maternal Grandfather    Hypertension Maternal Grandfather    Heart attack Maternal Grandfather    Liver disease Paternal Grandmother        ?kidney   Stroke Paternal Grandfather    Cancer Paternal Uncle        ?    Social History   Tobacco Use   Smoking status: Never   Smokeless tobacco: Never  Vaping Use   Vaping status: Never Used  Substance Use Topics   Alcohol use: Yes    Comment: 4 drinks every other weekend   Drug use: No  Review of Systems:   Review of Systems  Constitutional:  Negative for chills, fever, malaise/fatigue and weight loss.  HENT:  Negative for hearing loss, sinus pain and sore throat.   Respiratory:  Negative for cough and hemoptysis.   Cardiovascular:  Negative for chest pain, palpitations, leg swelling and PND.  Gastrointestinal:  Negative for abdominal pain, constipation, diarrhea, heartburn, nausea and vomiting.  Genitourinary:  Negative for dysuria, frequency and urgency.  Musculoskeletal:  Negative for back pain, myalgias and neck pain.  Skin:  Negative for itching and rash.  Neurological:  Negative for dizziness, tingling, seizures and headaches.  Endo/Heme/Allergies:  Negative for polydipsia.  Psychiatric/Behavioral:  Negative for depression. The patient is not nervous/anxious.       Objective:   BP 110/80  (BP Location: Left Arm, Patient Position: Sitting, Cuff Size: Normal)   Pulse 77   Temp 98.2 F (36.8 C) (Temporal)   Ht 5\' 5"  (1.651 m)   Wt 139 lb 8 oz (63.3 kg)   LMP 03/13/2023 (Approximate)   SpO2 99%   BMI 23.21 kg/m  Body mass index is 23.21 kg/m.   General Appearance:    Alert, cooperative, no distress, appears stated age  Head:    Normocephalic, without obvious abnormality, atraumatic  Eyes:    PERRL, conjunctiva/corneas clear, EOM's intact, fundi    benign, both eyes  Ears:    Normal TM's and external ear canals, both ears  Nose:   Nares normal, septum midline, mucosa normal, no drainage    or sinus tenderness  Throat:   Lips, mucosa, and tongue normal; teeth and gums normal  Neck:   Supple, symmetrical, trachea midline, no adenopathy;    thyroid:  no enlargement/tenderness/nodules; no carotid   bruit or JVD  Back:     Symmetric, no curvature, ROM normal, no CVA tenderness  Lungs:     Clear to auscultation bilaterally, respirations unlabored  Chest Wall:    No tenderness or deformity   Heart:    Regular rate and rhythm, S1 and S2 normal, no murmur, rub or gallop  Breast Exam:    Deferred  Abdomen:     Soft, non-tender, bowel sounds active all four quadrants,    no masses, no organomegaly  Genitalia:    Deferred  Extremities:   Extremities normal, atraumatic, no cyanosis or edema  Pulses:   2+ and symmetric all extremities  Skin:   Skin color, texture, turgor normal, no rashes or lesions  Lymph nodes:   Cervical, supraclavicular, and axillary nodes normal  Neurologic:   CNII-XII intact, normal strength, sensation and reflexes    throughout    Assessment/Plan:   Routine physical examination Today patient counseled on age appropriate routine health concerns for screening and prevention, each reviewed and up to date or declined. Immunizations reviewed and up to date or declined. Labs ordered and reviewed. Risk factors for depression reviewed and negative. Hearing  function and visual acuity are intact. ADLs screened and addressed as needed. Functional ability and level of safety reviewed and appropriate. Education, counseling and referrals performed based on assessed risks today. Patient provided with a copy of personalized plan for preventive services.  Vitamin D deficiency Update vitamin D and provide recommendations   Anxiety Stable on amitriptyline 25 mg daily Follow-up in 1 year, sooner if concerns  Iron deficiency anemia, unspecified iron deficiency anemia type Update iron panel and provide recommendations accordingly  Encounter for lipid screening for cardiovascular disease Update lipid panel and provide recommendations  Family history of PCOS Could consider hormonal testing however she is currently on oral contraceptive pill(s) so I'm not sure of the utility of this Continue to monitor symptom(s), consider referral to gynecology    I, Isabelle Course, acting as a scribe for Jarold Motto, [place]., have documented all relevant documentation on the behalf of Jarold Motto, [place], as directed by  Jarold Motto, PA while in the presence of Jarold Motto, [place].  I, Isabelle Course, have reviewed all documentation for this visit. The documentation on 03/26/23 for the exam, diagnosis, procedures, and orders are all accurate and complete.  Jarold Motto, PA-C  [facility]

## 2023-03-26 NOTE — Patient Instructions (Signed)
 It was great to see you!  Please go to the lab for blood work.   Our office will call you with your results unless you have chosen to receive results via MyChart.  If your blood work is normal we will follow-up each year for physicals and as scheduled for chronic medical problems.  If anything is abnormal we will treat accordingly and get you in for a follow-up.  Take care,  Lelon Mast

## 2023-03-27 ENCOUNTER — Encounter: Payer: Self-pay | Admitting: Physician Assistant

## 2023-04-11 ENCOUNTER — Other Ambulatory Visit: Payer: Self-pay | Admitting: Physician Assistant

## 2023-06-05 ENCOUNTER — Telehealth: Payer: Self-pay | Admitting: Physician Assistant

## 2023-06-05 MED ORDER — NORETHIN ACE-ETH ESTRAD-FE 1-20 MG-MCG PO TABS: 1.0000 | ORAL_TABLET | Freq: Every day | ORAL | 3 refills | Status: AC

## 2023-06-05 NOTE — Telephone Encounter (Signed)
 Rx Aurovela Fe 1-20 was sent to [facility] as requested.

## 2023-06-05 NOTE — Telephone Encounter (Signed)
 Prescription Request  06/05/2023  LOV: 03/26/2023  What is the name of the medication or equipment? AUROVELA FE 1/20 1-20 MG-MCG tablet   Have you contacted your pharmacy to request a refill? Yes   Which pharmacy would you like this sent to?   [phone] - [place], [place] - [address] Phone: [phone]  Fax: [phone]      Patient notified that their request is being sent to the clinical staff for review and that they should receive a response within 2 business days.   Please advise at Mobile [phone] (mobile)

## 2023-07-02 ENCOUNTER — Other Ambulatory Visit: Payer: Self-pay | Admitting: Physician Assistant

## 2023-07-02 MED ORDER — AMITRIPTYLINE HCL 25 MG PO TABS: 25.0000 mg | ORAL_TABLET | Freq: Every day | ORAL | 1 refills | Status: AC

## 2023-07-02 NOTE — Telephone Encounter (Signed)
 Copied from CRM [id number]. Topic: Clinical - Medication Refill >> Jul 02, 2023  3:36 PM Kita Perish H wrote: Medication: amitriptyline  (ELAVIL ) 25 MG tablet  Has the patient contacted their pharmacy? Yes, tried online but need provider authorization (Agent: If no, request that the patient contact the pharmacy for the refill. If patient does not wish to contact the pharmacy document the reason why and proceed with request.) (Agent: If yes, when and what did the pharmacy advise?)   [phone] [place], [place] - [address] [place] [address] Phone: [phone] Fax: [phone]  Is this the correct pharmacy for this prescription? Yes If no, delete pharmacy and type the correct one.   Has the prescription been filled recently? No  Is the patient out of the medication? Yes, lost a few while out of town  Has the patient been seen for an appointment in the last year OR does the patient have an upcoming appointment? Yes  Can we respond through MyChart? Yes  Agent: Please be advised that Rx refills may take up to 3 business days. We ask that you follow-up with your pharmacy.

## 2024-04-11 ENCOUNTER — Encounter: Payer: Self-pay | Admitting: Physician Assistant
